# Patient Record
Sex: Female | Born: 1970
Health system: Southern US, Community
[De-identification: ages and names within clinical notes are randomized; demographics above are authoritative.]

## PROBLEM LIST (undated history)

## (undated) DIAGNOSIS — T4145XA Adverse effect of unspecified anesthetic, initial encounter: Secondary | ICD-10-CM

## (undated) DIAGNOSIS — F32A Depression, unspecified: Secondary | ICD-10-CM

## (undated) DIAGNOSIS — N912 Amenorrhea, unspecified: Secondary | ICD-10-CM

## (undated) DIAGNOSIS — E282 Polycystic ovarian syndrome: Secondary | ICD-10-CM

## (undated) DIAGNOSIS — D649 Anemia, unspecified: Secondary | ICD-10-CM

## (undated) DIAGNOSIS — I10 Essential (primary) hypertension: Secondary | ICD-10-CM

## (undated) DIAGNOSIS — M205X9 Other deformities of toe(s) (acquired), unspecified foot: Secondary | ICD-10-CM

## (undated) DIAGNOSIS — R112 Nausea with vomiting, unspecified: Secondary | ICD-10-CM

## (undated) DIAGNOSIS — Z9889 Other specified postprocedural states: Secondary | ICD-10-CM

## (undated) DIAGNOSIS — F4310 Post-traumatic stress disorder, unspecified: Secondary | ICD-10-CM

## (undated) DIAGNOSIS — K219 Gastro-esophageal reflux disease without esophagitis: Secondary | ICD-10-CM

## (undated) DIAGNOSIS — J45909 Unspecified asthma, uncomplicated: Secondary | ICD-10-CM

## (undated) DIAGNOSIS — F329 Major depressive disorder, single episode, unspecified: Secondary | ICD-10-CM

## (undated) DIAGNOSIS — R197 Diarrhea, unspecified: Secondary | ICD-10-CM

## (undated) DIAGNOSIS — Z8489 Family history of other specified conditions: Secondary | ICD-10-CM

## (undated) DIAGNOSIS — F419 Anxiety disorder, unspecified: Secondary | ICD-10-CM

## (undated) DIAGNOSIS — T8859XA Other complications of anesthesia, initial encounter: Secondary | ICD-10-CM

## (undated) DIAGNOSIS — M1990 Unspecified osteoarthritis, unspecified site: Secondary | ICD-10-CM

## (undated) DIAGNOSIS — M2020 Hallux rigidus, unspecified foot: Secondary | ICD-10-CM

## (undated) DIAGNOSIS — E7800 Pure hypercholesterolemia, unspecified: Secondary | ICD-10-CM

## (undated) DIAGNOSIS — N979 Female infertility, unspecified: Secondary | ICD-10-CM

## (undated) HISTORY — DX: Amenorrhea, unspecified: N91.2

## (undated) HISTORY — PX: KNEE ARTHROSCOPY: SUR90

## (undated) HISTORY — DX: Major depressive disorder, single episode, unspecified: F32.9

## (undated) HISTORY — DX: Polycystic ovarian syndrome: E28.2

## (undated) HISTORY — DX: Other specified postprocedural states: Z98.890

## (undated) HISTORY — DX: Pure hypercholesterolemia, unspecified: E78.00

## (undated) HISTORY — DX: Female infertility, unspecified: N97.9

## (undated) HISTORY — PX: CARPAL TUNNEL RELEASE: SHX101

## (undated) HISTORY — DX: Depression, unspecified: F32.A

## (undated) HISTORY — PX: BACK SURGERY: SHX140

## (undated) HISTORY — DX: Anemia, unspecified: D64.9

## (undated) HISTORY — DX: Gastro-esophageal reflux disease without esophagitis: K21.9

---

## 1992-08-15 HISTORY — PX: FINGER TENDON REPAIR: SHX1640

## 1998-04-09 ENCOUNTER — Ambulatory Visit (HOSPITAL_COMMUNITY): Admission: RE | Admit: 1998-04-09 | Discharge: 1998-04-09 | Payer: Self-pay | Admitting: Orthopedic Surgery

## 1998-12-14 ENCOUNTER — Other Ambulatory Visit: Admission: RE | Admit: 1998-12-14 | Discharge: 1998-12-14 | Payer: Self-pay | Admitting: Obstetrics and Gynecology

## 2000-01-28 ENCOUNTER — Other Ambulatory Visit: Admission: RE | Admit: 2000-01-28 | Discharge: 2000-01-28 | Payer: Self-pay | Admitting: Obstetrics and Gynecology

## 2001-04-25 ENCOUNTER — Encounter: Admission: RE | Admit: 2001-04-25 | Discharge: 2001-04-25 | Payer: Self-pay | Admitting: Gastroenterology

## 2001-04-25 ENCOUNTER — Encounter: Payer: Self-pay | Admitting: Gastroenterology

## 2001-05-28 ENCOUNTER — Encounter: Admission: RE | Admit: 2001-05-28 | Discharge: 2001-05-28 | Payer: Self-pay | Admitting: Gastroenterology

## 2001-05-28 ENCOUNTER — Encounter: Payer: Self-pay | Admitting: Gastroenterology

## 2002-02-12 ENCOUNTER — Other Ambulatory Visit: Admission: RE | Admit: 2002-02-12 | Discharge: 2002-02-12 | Payer: Self-pay | Admitting: Obstetrics and Gynecology

## 2002-03-11 ENCOUNTER — Encounter: Payer: Self-pay | Admitting: Emergency Medicine

## 2002-03-11 ENCOUNTER — Emergency Department (HOSPITAL_COMMUNITY): Admission: EM | Admit: 2002-03-11 | Discharge: 2002-03-11 | Payer: Self-pay | Admitting: Emergency Medicine

## 2003-01-31 ENCOUNTER — Encounter (INDEPENDENT_AMBULATORY_CARE_PROVIDER_SITE_OTHER): Payer: Self-pay | Admitting: Specialist

## 2003-01-31 ENCOUNTER — Ambulatory Visit (HOSPITAL_COMMUNITY): Admission: RE | Admit: 2003-01-31 | Discharge: 2003-01-31 | Payer: Self-pay | Admitting: Obstetrics and Gynecology

## 2003-09-30 ENCOUNTER — Ambulatory Visit (HOSPITAL_COMMUNITY): Admission: RE | Admit: 2003-09-30 | Discharge: 2003-09-30 | Payer: Self-pay | Admitting: Family Medicine

## 2004-01-14 DIAGNOSIS — Z9889 Other specified postprocedural states: Secondary | ICD-10-CM

## 2004-01-14 HISTORY — DX: Other specified postprocedural states: Z98.890

## 2004-02-04 ENCOUNTER — Ambulatory Visit (HOSPITAL_COMMUNITY): Admission: RE | Admit: 2004-02-04 | Discharge: 2004-02-05 | Payer: Self-pay | Admitting: Neurosurgery

## 2004-06-15 HISTORY — PX: TONSILLECTOMY AND ADENOIDECTOMY: SUR1326

## 2005-05-15 HISTORY — PX: CHOLECYSTECTOMY: SHX55

## 2006-05-04 ENCOUNTER — Other Ambulatory Visit: Admission: RE | Admit: 2006-05-04 | Discharge: 2006-05-04 | Payer: Self-pay | Admitting: Obstetrics and Gynecology

## 2007-05-18 ENCOUNTER — Other Ambulatory Visit: Admission: RE | Admit: 2007-05-18 | Discharge: 2007-05-18 | Payer: Self-pay | Admitting: Obstetrics and Gynecology

## 2008-06-12 ENCOUNTER — Other Ambulatory Visit: Admission: RE | Admit: 2008-06-12 | Discharge: 2008-06-12 | Payer: Self-pay | Admitting: Obstetrics and Gynecology

## 2008-06-12 ENCOUNTER — Encounter: Payer: Self-pay | Admitting: Obstetrics and Gynecology

## 2008-06-12 ENCOUNTER — Ambulatory Visit: Payer: Self-pay | Admitting: Obstetrics and Gynecology

## 2008-07-03 ENCOUNTER — Ambulatory Visit: Payer: Self-pay | Admitting: Obstetrics and Gynecology

## 2008-07-29 ENCOUNTER — Ambulatory Visit: Payer: Self-pay | Admitting: Obstetrics and Gynecology

## 2009-07-02 ENCOUNTER — Other Ambulatory Visit: Admission: RE | Admit: 2009-07-02 | Discharge: 2009-07-02 | Payer: Self-pay | Admitting: Obstetrics and Gynecology

## 2009-07-02 ENCOUNTER — Ambulatory Visit: Payer: Self-pay | Admitting: Obstetrics and Gynecology

## 2009-11-03 ENCOUNTER — Ambulatory Visit: Payer: Self-pay | Admitting: Obstetrics and Gynecology

## 2010-01-26 ENCOUNTER — Ambulatory Visit: Payer: Self-pay | Admitting: Obstetrics and Gynecology

## 2010-03-19 ENCOUNTER — Ambulatory Visit: Payer: Self-pay | Admitting: Obstetrics and Gynecology

## 2010-05-21 ENCOUNTER — Ambulatory Visit: Payer: Self-pay | Admitting: Obstetrics and Gynecology

## 2010-07-07 ENCOUNTER — Other Ambulatory Visit: Admission: RE | Admit: 2010-07-07 | Discharge: 2010-07-07 | Payer: Self-pay | Admitting: Obstetrics and Gynecology

## 2010-07-07 ENCOUNTER — Ambulatory Visit: Payer: Self-pay | Admitting: Obstetrics and Gynecology

## 2010-12-31 NOTE — H&P (Signed)
NAME:  Phyllis Delgado, Phyllis Delgado                        ACCOUNT NO.:  [id number]   MEDICAL RECORD NO.:  [id number]                   PATIENT TYPE:  AMB   LOCATION:  SDC                                  FACILITY:  [facility]   PHYSICIAN:  Guy Sandifer. Arleta Creek, M.D.           DATE OF BIRTH:  06-21-1971   DATE OF ADMISSION:  01/31/2003  DATE OF DISCHARGE:                                HISTORY & PHYSICAL   CHIEF COMPLAINT:  Heavy menses.   HISTORY OF PRESENT ILLNESS:  This patient is a 40 year old married white  female G0, P0, who has always had irregular menses.  However they have  become increasingly so over the past several months.  She has had clotting  with her menses as well.  On Dec 23, 2002 ultrasound reveals a uterus  measuring 10.3 x 4.0 x 3.3 cm.  Ovaries are polycystic in appearance.  Sonohistogram is consistent with a 13 mm endometrial mass, most likely a  polyp.  After discussion of the options she is being admitted for  hysteroscopy with resectoscope and D&C.   PAST MEDICAL HISTORY:  1. Arthritis.  2. Reflux.  3. Depression.   PAST SURGICAL HISTORY:  1. Right knee arthroscopy 1999.  2. Right carpal tunnel release 1997.  3. Left index tendon repair 1994.   SOCIAL HISTORY:  The patient denies tobacco, alcohol, or drug abuse.   FAMILY HISTORY:  Positive for diabetes in mother, father, 2 brothers and 1  sister.  Heart disease in mother and father.  Heart attacks in mother and  father.  High blood pressure in father, and diverticulosis in mother and  father.   MEDICATIONS:  1. Glucophage daily.  2. Nexium daily.   ALLERGIES:  CRESTOR leading to itching, LIPITOR leading to muscle aches.   REVIEW OF SYSTEMS:  NEUROLOGIC:  She has premenstrual migraine headaches.  PULMONARY:  Denies shortness of breath.  CARDIOVASCULAR:  Denies chest pain.  GI:  Esophageal reflux as above.  MUSCULOSKELETAL: History of arthritis.  GU: Denies dysuria or hematuria or incontinence.   PHYSICAL  EXAMINATION:  VITAL SIGNS:  Weight 233 pounds.  Blood  pressure110/74.  HEENT AND NECK:  Without thyromegaly.  LUNGS:  Clear to auscultation.  HEART:  Regular rate and rhythm.  BACK:  Without CVA tenderness.  BREASTS:  Without mass, retraction, or discharge.  ABDOMEN:  Obese, soft, nontender without palpable masses.  PELVIC:  Bulge on the cervix without lesion.  Uterus normal size, nontender.  Adnexa nontender without masses.  Pelvic exam compromised with patient  obesity.  EXTREMITIES AND NEUROLOGICAL:  Grossly within normal limits.   ASSESSMENT:  Menorrhagia.   PLAN:  1. Hysteroscopy with resectoscope.  2. Dilatation and curettage.  Guy Sandifer Arleta Creek, M.D.    JET/MEDQ  D:  01/27/2003  T:  01/27/2003  Job:  [id number]

## 2010-12-31 NOTE — Op Note (Signed)
NAME:  Phyllis Delgado, Phyllis Delgado                        ACCOUNT NO.:  [id number]   MEDICAL RECORD NO.:  [id number]                   PATIENT TYPE:  OIB   LOCATION:  3002                                 FACILITY:  [facility]   PHYSICIAN:  Coletta Memos, M.D.                  DATE OF BIRTH:  [birth year]   DATE OF PROCEDURE:  02/04/2004  DATE OF DISCHARGE:  02/05/2004                                 OPERATIVE REPORT   PREOPERATIVE DIAGNOSIS:  Displaced disk left L4-5, L5-S1.   POSTOPERATIVE DIAGNOSIS:  Displaced disk left L4-5, L5-S1.   PROCEDURE:  Left L4-5 semi-hemilaminectomy and foraminotomy with  microdissection, left L5-S1 semi-hemilaminectomy and diskectomy with  microdissection.   SURGEON:  Coletta Memos, M.D.   ASSISTANT:  Danae Orleans. Venetia Maxon, M.D.   ANESTHESIA:  General.   INDICATIONS FOR PROCEDURE:  Phyllis Delgado is a 40 year old woman who  presented with severe pain in the left lower extremity since November 14, 2003.  The pain came with left leg greater lower extremity pain, numbness extending  to the great toe on the left side. She had occasional right-sided pain.  MRI  showed a very large, herniated disk at L5-S1 and what I believed to be a  disk at L4-5. I therefore recommended, and she agreed, to undergo operative  decompression after conservative treatment was unsuccessful.   DESCRIPTION OF PROCEDURE:  [name] was brought to the operating room,  intubated and placed under a general anesthetic without difficulty.  She was  placed onto the Wilson frame and all pressure points were properly padded.  Her back was prepped and she was draped in a sterile fashion.  Using a  preoperative localizing film, I infiltrated 20 cc of 0.5% lidocaine,  1:200,000 strength epinephrine into the lumbar region.   I opened the skin with a #10 blade, and carried the incision down to the  thoracolumbar fascia.  After placing a self-retaining retractor, with the  monopolar cautery, opened the  thoracolumbar fascia, exposing the lamina of  L4-L5 and of S1.  I had another x-ray performed. I then proceded with a semi-  hemilaminectomy of L4-5 after confirming I was in the correct location.  I  then removed the ligamentum flavum between L4 and 5.  I exposed the thecal  sac and was able to retract that medially.  I did not appreciate disk  herniation, nor did I appreciate any significant tightness; but I did  perform a foraminotomy there, and felt that the L5 nerve root had free  egress.   At the L5-S1 space, however, it was a very different story.  It was  extraordinarily tight, once I had exposed the thecal sac.  What I  encountered was a partially calcified and very large disk herniation at L5-  S1. With microscopic dissection as was used at the L4-5 space, I with Dr.  Venetia Maxon, retracted the thecal sac medially and then performed  a diskectomy  using curets and the surgical, dynamic resectors to push down the calcified  portion of the mass, using  pituitary rongeurs and curets to remove the soft portion.  This was done,  and I felt that there was excellent decompression of the left S1 nerve root.  I then irrigated the wound and closed the wound in layered fashion using  Vicryl sutures.  Dermabond was used for a sterile dressing. The patient  tolerated the procedure well.                                               Coletta Memos, M.D.    KC/MEDQ  D:  02/04/2004  T:  02/05/2004  Job:  [id number]

## 2010-12-31 NOTE — Op Note (Signed)
NAME:  Phyllis Delgado, Phyllis Delgado                        ACCOUNT NO.:  [id number]   MEDICAL RECORD NO.:  [id number]                   PATIENT TYPE:  AMB   LOCATION:  SDC                                  FACILITY:  [facility]   PHYSICIAN:  Guy Sandifer. Arleta Creek, M.D.           DATE OF BIRTH:  [birth year]   DATE OF PROCEDURE:  01/31/2003  DATE OF DISCHARGE:                                 OPERATIVE REPORT   PREOPERATIVE DIAGNOSIS:  Menorrhagia.   POSTOPERATIVE DIAGNOSIS:  Endometrial polyp.   PROCEDURE:  Hysteroscopy with resection of endometrial polyp and dilatation  and curettage.   SURGEON:  Guy Sandifer. Henderson Cloud, M.D.   ANESTHESIA:  1. MAC.  2. 1% Xylocaine paracervical block.   ESTIMATED BLOOD LOSS:  Less than 50 mL.   INTAKE AND OUTPUT:  Sorbitol distending media 50 mL deficit.   SPECIMENS:  1. Endometrial curettings.  2. Endometrial polyp.   INDICATIONS AND CONSENT:  This patient is a 40 year old married white  female, G0, P0, with increasingly heavy, irregular menses.  Details are  dictated in the history and physical.  Hysteroscopy with resectoscope D&C is  discussed with the patient.  Potential risks and complications have been  discussed with the patient, including but not limited to infection, uterine  perforation, bowel, bladder, or ureteral damage, bleeding requiring  transfusion of blood products with possible transfusion reaction, HIV and  hepatitis acquisition, DVT, PE, pneumonia, hysterectomy, laparotomy,  laparoscopy.  All questions have been answered and consent is signed on the  chart.   FINDINGS:  There is an approximately 1-1.5 cm polypoid-type structure in the  upper posterior endometrial canal.   PROCEDURE:  The patient was taken to the operating room and placed in the  dorsal supine position, where anesthesia is administered with MAC.  She is  then placed in the dorsal lithotomy position, where she is prepped, the  bladder straight catheterized, and she is draped in  a sterile fashion.  A  bivalve speculum is placed in the vagina.  The anterior cervical lip is  injected with 1% Xylocaine and grasped with a single-tooth tenaculum.  A  paracervical block is placed at the 2, 4, 5, 7, 8, and 10 o'clock positions  with approximately 20 mL total of 1% Xylocaine.  The cervix is gently  progressively dilated to a 23 Pratt dilator.  Diagnostic hysteroscope is  placed in the endocervical canal and advanced under direct visualization  using sorbitol distending media.  The above findings were noted.  The  hysteroscope is withdrawn and sharp curettage is carried out.  Reinspection  with the diagnostic hysteroscope reveals the polyp to persist.  Therefore,  the cervix is dilated to a 35 Pratt dilator.  The resectoscope with the  single wide angle wire loop is placed and again advanced under direct  visualization using sorbitol distending media.  The polyp  is resected in simple fashion without difficulty.  The hysteroscope  is  withdrawn and the procedure is ended.  All instruments are removed, good  hemostasis is noted.  All counts are correct.  The patient did receive IV  antibiotics.  The patient is transferred to the recovery room in stable  condition.                                               Guy Sandifer Arleta Creek, M.D.    JET/MEDQ  D:  01/31/2003  T:  01/31/2003  Job:  [id number]

## 2011-01-03 ENCOUNTER — Ambulatory Visit (INDEPENDENT_AMBULATORY_CARE_PROVIDER_SITE_OTHER): Payer: PRIVATE HEALTH INSURANCE | Admitting: Women's Health

## 2011-01-03 DIAGNOSIS — N949 Unspecified condition associated with female genital organs and menstrual cycle: Secondary | ICD-10-CM

## 2011-01-03 DIAGNOSIS — N920 Excessive and frequent menstruation with regular cycle: Secondary | ICD-10-CM

## 2011-01-07 ENCOUNTER — Ambulatory Visit (INDEPENDENT_AMBULATORY_CARE_PROVIDER_SITE_OTHER): Payer: PRIVATE HEALTH INSURANCE | Admitting: Obstetrics and Gynecology

## 2011-01-07 ENCOUNTER — Other Ambulatory Visit: Payer: Self-pay | Admitting: Obstetrics and Gynecology

## 2011-01-07 ENCOUNTER — Other Ambulatory Visit: Payer: PRIVATE HEALTH INSURANCE

## 2011-01-07 DIAGNOSIS — N920 Excessive and frequent menstruation with regular cycle: Secondary | ICD-10-CM

## 2011-01-07 DIAGNOSIS — N949 Unspecified condition associated with female genital organs and menstrual cycle: Secondary | ICD-10-CM

## 2011-01-07 DIAGNOSIS — D3910 Neoplasm of uncertain behavior of unspecified ovary: Secondary | ICD-10-CM

## 2011-03-08 ENCOUNTER — Other Ambulatory Visit: Payer: PRIVATE HEALTH INSURANCE

## 2011-03-08 ENCOUNTER — Ambulatory Visit: Payer: Self-pay

## 2011-03-08 ENCOUNTER — Ambulatory Visit (INDEPENDENT_AMBULATORY_CARE_PROVIDER_SITE_OTHER): Payer: PRIVATE HEALTH INSURANCE | Admitting: Obstetrics and Gynecology

## 2011-03-08 ENCOUNTER — Encounter: Payer: Self-pay | Admitting: Obstetrics and Gynecology

## 2011-03-08 DIAGNOSIS — N83209 Unspecified ovarian cyst, unspecified side: Secondary | ICD-10-CM

## 2011-03-08 DIAGNOSIS — L909 Atrophic disorder of skin, unspecified: Secondary | ICD-10-CM

## 2011-03-08 DIAGNOSIS — N912 Amenorrhea, unspecified: Secondary | ICD-10-CM

## 2011-03-08 DIAGNOSIS — E282 Polycystic ovarian syndrome: Secondary | ICD-10-CM

## 2011-03-08 DIAGNOSIS — N921 Excessive and frequent menstruation with irregular cycle: Secondary | ICD-10-CM

## 2011-03-08 DIAGNOSIS — L919 Hypertrophic disorder of the skin, unspecified: Secondary | ICD-10-CM

## 2011-03-08 NOTE — Progress Notes (Signed)
The patient came today for a followup ultrasound because of a right ovarian cyst. On ultrasound today she has an anteverted uterus with a homogeneous echo pattern. Her endometrial echo is thin at 6 mm. She is having amenorrhea again with her last menstrual period at the end of April. Her right ovary still shows a cyst,  it appears possibly to be a different cyst. It is now to 2x 3 cm with a thin septum. It is negative for PSD. On the left ovary there is no cyst at all. Her cul-de-sac is negative for fluid. Findings were discussed with the patient and her husband and she was reassured. We will re\re ultrasound in 6 months. I will see her in November for her yearly visit and we will schedule it at that time. We will do medroxyprogesterone withdrawal  So she will not develop endometrial hyperplasia. She was given a prescription for medroxyprogesterone 10 mg #5 with instructions to take 1 daily for 5 days every 3 months that she doesn't bleed. She will always do a pregnancy test first.

## 2011-03-29 ENCOUNTER — Other Ambulatory Visit: Payer: Self-pay | Admitting: Obstetrics and Gynecology

## 2011-03-30 NOTE — Telephone Encounter (Signed)
PT. LOST RX FROM RECENT OV . NEEDS REFILLS. SENT TO [facility].

## 2011-05-04 ENCOUNTER — Encounter: Payer: Self-pay | Admitting: Obstetrics and Gynecology

## 2015-09-11 DIAGNOSIS — R000 Tachycardia, unspecified: Secondary | ICD-10-CM

## 2015-09-11 DIAGNOSIS — R002 Palpitations: Secondary | ICD-10-CM

## 2015-09-11 HISTORY — DX: Tachycardia, unspecified: R00.0

## 2015-09-11 HISTORY — DX: Palpitations: R00.2

## 2016-04-29 DIAGNOSIS — L4050 Arthropathic psoriasis, unspecified: Secondary | ICD-10-CM

## 2016-04-29 HISTORY — DX: Arthropathic psoriasis, unspecified: L40.50

## 2018-03-01 DIAGNOSIS — M2021 Hallux rigidus, right foot: Secondary | ICD-10-CM

## 2018-03-01 HISTORY — DX: Hallux rigidus, right foot: M20.21

## 2018-04-18 ENCOUNTER — Other Ambulatory Visit: Payer: Self-pay | Admitting: Neurosurgery

## 2018-04-19 ENCOUNTER — Other Ambulatory Visit: Payer: Self-pay

## 2018-04-19 ENCOUNTER — Encounter (HOSPITAL_COMMUNITY): Payer: Self-pay | Admitting: *Deleted

## 2018-04-19 NOTE — Progress Notes (Signed)
Phyllis Delgado denioes chest pain or shortness of breath. Patient reports that she had  Palpations in the past,  2017, she had a stress test and it was normal . No longer has palpations.   Phyllis Delgado has arthritis; RA or Psoriatic.  Patient is on [place],  Surgeon did not mention stopping. Phyllis Delgado has Type II diabetes, last A1C was 6.8 , 02/03/18.  Patient reports that CBG run 70 -150. I instructed patient to hold Metformin in am. I instructed patient to check CBG after awaking and every 2 hours until arrival  to the hospital.  I Instructed patient if CBG is less than 70 to drink 1/2 cup of a clear juice. Recheck CBG in 15 minutes then call pre- op desk at [phone] for further instructions.

## 2018-04-20 ENCOUNTER — Ambulatory Visit (HOSPITAL_COMMUNITY)
Admission: RE | Admit: 2018-04-20 | Discharge: 2018-04-21 | Disposition: A | Discharge status: Home / Self Care | Payer: PRIVATE HEALTH INSURANCE | Source: Ambulatory Visit | Attending: Neurosurgery | Admitting: Neurosurgery

## 2018-04-20 ENCOUNTER — Ambulatory Visit (HOSPITAL_COMMUNITY): Payer: PRIVATE HEALTH INSURANCE | Admitting: Anesthesiology

## 2018-04-20 ENCOUNTER — Encounter (HOSPITAL_COMMUNITY): Payer: Self-pay | Admitting: General Practice

## 2018-04-20 ENCOUNTER — Encounter (HOSPITAL_COMMUNITY)
Admission: RE | Disposition: A | Discharge status: Home / Self Care | Payer: Self-pay | Source: Ambulatory Visit | Attending: Neurosurgery

## 2018-04-20 ENCOUNTER — Ambulatory Visit (HOSPITAL_COMMUNITY): Payer: PRIVATE HEALTH INSURANCE

## 2018-04-20 DIAGNOSIS — M4712 Other spondylosis with myelopathy, cervical region: Secondary | ICD-10-CM | POA: Diagnosis present

## 2018-04-20 DIAGNOSIS — E119 Type 2 diabetes mellitus without complications: Secondary | ICD-10-CM | POA: Insufficient documentation

## 2018-04-20 DIAGNOSIS — I10 Essential (primary) hypertension: Secondary | ICD-10-CM | POA: Diagnosis not present

## 2018-04-20 DIAGNOSIS — M542 Cervicalgia: Secondary | ICD-10-CM | POA: Diagnosis present

## 2018-04-20 DIAGNOSIS — M479 Spondylosis, unspecified: Secondary | ICD-10-CM

## 2018-04-20 DIAGNOSIS — M4722 Other spondylosis with radiculopathy, cervical region: Secondary | ICD-10-CM

## 2018-04-20 DIAGNOSIS — M50022 Cervical disc disorder at C5-C6 level with myelopathy: Secondary | ICD-10-CM | POA: Insufficient documentation

## 2018-04-20 DIAGNOSIS — Z7984 Long term (current) use of oral hypoglycemic drugs: Secondary | ICD-10-CM | POA: Diagnosis not present

## 2018-04-20 HISTORY — DX: Post-traumatic stress disorder, unspecified: F43.10

## 2018-04-20 HISTORY — DX: Unspecified osteoarthritis, unspecified site: M19.90

## 2018-04-20 HISTORY — DX: Adverse effect of unspecified anesthetic, initial encounter: T41.45XA

## 2018-04-20 HISTORY — DX: Nausea with vomiting, unspecified: R11.2

## 2018-04-20 HISTORY — DX: Other spondylosis with myelopathy, cervical region: M47.12

## 2018-04-20 HISTORY — DX: Other complications of anesthesia, initial encounter: T88.59XA

## 2018-04-20 HISTORY — DX: Unspecified asthma, uncomplicated: J45.909

## 2018-04-20 HISTORY — DX: Other specified postprocedural states: Z98.890

## 2018-04-20 HISTORY — DX: Diarrhea, unspecified: R19.7

## 2018-04-20 HISTORY — DX: Anxiety disorder, unspecified: F41.9

## 2018-04-20 HISTORY — PX: ANTERIOR CERVICAL DECOMP/DISCECTOMY FUSION: SHX1161

## 2018-04-20 HISTORY — DX: Other deformities of toe(s) (acquired), unspecified foot: M20.5X9

## 2018-04-20 HISTORY — DX: Family history of other specified conditions: Z84.89

## 2018-04-20 HISTORY — DX: Essential (primary) hypertension: I10

## 2018-04-20 HISTORY — DX: Hallux rigidus, unspecified foot: M20.20

## 2018-04-20 HISTORY — DX: Other spondylosis with radiculopathy, cervical region: M47.22

## 2018-04-20 LAB — CBC
HCT: 38.8 % (ref 36.0–46.0)
Hemoglobin: 12.2 g/dL (ref 12.0–15.0)
MCH: 28.5 pg (ref 26.0–34.0)
MCHC: 31.4 g/dL (ref 30.0–36.0)
MCV: 90.7 fL (ref 78.0–100.0)
PLATELETS: 338 10*3/uL (ref 150–400)
RBC: 4.28 MIL/uL (ref 3.87–5.11)
RDW: 14.2 % (ref 11.5–15.5)
WBC: 6.7 10*3/uL (ref 4.0–10.5)

## 2018-04-20 LAB — BASIC METABOLIC PANEL
Anion gap: 9 (ref 5–15)
BUN: 13 mg/dL (ref 6–20)
CALCIUM: 9.1 mg/dL (ref 8.9–10.3)
CO2: 20 mmol/L — ABNORMAL LOW (ref 22–32)
CREATININE: 0.78 mg/dL (ref 0.44–1.00)
Chloride: 110 mmol/L (ref 98–111)
GFR calc Af Amer: >60 mL/min (ref >60)
GFR calc non Af Amer: >60 mL/min (ref >60)
Glucose, Bld: 144 mg/dL — ABNORMAL HIGH (ref 70–99)
Potassium: 4.1 mmol/L (ref 3.5–5.1)
SODIUM: 139 mmol/L (ref 135–145)

## 2018-04-20 LAB — GLUCOSE, CAPILLARY
GLUCOSE-CAPILLARY: 140 mg/dL — AB (ref 70–99)
GLUCOSE-CAPILLARY: 113 mg/dL — AB (ref 70–99)
GLUCOSE-CAPILLARY: 183 mg/dL — AB (ref 70–99)
Glucose-Capillary: 254 mg/dL — ABNORMAL HIGH (ref 70–99)

## 2018-04-20 LAB — POCT PREGNANCY, URINE: PREG TEST UR: NEGATIVE

## 2018-04-20 LAB — ABO/RH: ABO/RH(D): O POS

## 2018-04-20 LAB — HEMOGLOBIN A1C
Hgb A1c MFr Bld: 7.1 % — ABNORMAL HIGH (ref 4.8–5.6)
Mean Plasma Glucose: 157.07 mg/dL

## 2018-04-20 LAB — TYPE AND SCREEN
ABO/RH(D): O POS
ANTIBODY SCREEN: NEGATIVE

## 2018-04-20 SURGERY — ANTERIOR CERVICAL DECOMPRESSION/DISCECTOMY FUSION 1 LEVEL
Anesthesia: General

## 2018-04-20 MED ORDER — METFORMIN HCL 850 MG PO TABS
850.0000 mg | ORAL_TABLET | Freq: Two times a day (BID) | ORAL | Status: DC
  Administered 2018-04-20 – 2018-04-21 (×2): 850 mg via ORAL
  Filled 2018-04-20 (×2): qty 1

## 2018-04-20 MED ORDER — DEXAMETHASONE SODIUM PHOSPHATE 10 MG/ML IJ SOLN
INTRAMUSCULAR | Status: AC
  Filled 2018-04-20: qty 1

## 2018-04-20 MED ORDER — ACETAMINOPHEN 500 MG PO TABS
1000.0000 mg | ORAL_TABLET | Freq: Four times a day (QID) | ORAL | Status: DC
  Administered 2018-04-21 (×2): 1000 mg via ORAL
  Filled 2018-04-20 (×2): qty 2

## 2018-04-20 MED ORDER — ALBUTEROL SULFATE HFA 108 (90 BASE) MCG/ACT IN AERS: 2.0000 | INHALATION_SPRAY | Freq: Four times a day (QID) | RESPIRATORY_TRACT | Status: DC | PRN

## 2018-04-20 MED ORDER — CHLORHEXIDINE GLUCONATE CLOTH 2 % EX PADS: 6.0000 | MEDICATED_PAD | Freq: Once | CUTANEOUS | Status: DC

## 2018-04-20 MED ORDER — DOCUSATE SODIUM 100 MG PO CAPS
100.0000 mg | ORAL_CAPSULE | Freq: Two times a day (BID) | ORAL | Status: DC
  Administered 2018-04-20: 100 mg via ORAL
  Filled 2018-04-20: qty 1

## 2018-04-20 MED ORDER — MIDAZOLAM HCL 2 MG/2ML IJ SOLN
INTRAMUSCULAR | Status: AC
  Filled 2018-04-20: qty 2

## 2018-04-20 MED ORDER — FENTANYL CITRATE (PF) 100 MCG/2ML IJ SOLN
INTRAMUSCULAR | Status: AC
  Filled 2018-04-20: qty 2

## 2018-04-20 MED ORDER — DEXAMETHASONE SODIUM PHOSPHATE 10 MG/ML IJ SOLN
INTRAMUSCULAR | Status: DC | PRN
  Administered 2018-04-20: 10 mg via INTRAVENOUS

## 2018-04-20 MED ORDER — THROMBIN 5000 UNITS EX SOLR
CUTANEOUS | Status: AC
  Filled 2018-04-20: qty 10000

## 2018-04-20 MED ORDER — ONDANSETRON HCL 4 MG PO TABS: 4.0000 mg | ORAL_TABLET | Freq: Four times a day (QID) | ORAL | Status: DC | PRN

## 2018-04-20 MED ORDER — FENTANYL CITRATE (PF) 250 MCG/5ML IJ SOLN
INTRAMUSCULAR | Status: DC | PRN
  Administered 2018-04-20: 50 ug via INTRAVENOUS
  Administered 2018-04-20: 100 ug via INTRAVENOUS
  Administered 2018-04-20 (×4): 50 ug via INTRAVENOUS

## 2018-04-20 MED ORDER — SCOPOLAMINE 1 MG/3DAYS TD PT72
MEDICATED_PATCH | TRANSDERMAL | Status: AC
  Filled 2018-04-20: qty 1

## 2018-04-20 MED ORDER — PHENOL 1.4 % MT LIQD: 1.0000 | OROMUCOSAL | Status: DC | PRN

## 2018-04-20 MED ORDER — LIDOCAINE 2% (20 MG/ML) 5 ML SYRINGE
INTRAMUSCULAR | Status: AC
  Filled 2018-04-20: qty 5

## 2018-04-20 MED ORDER — LIDOCAINE 2% (20 MG/ML) 5 ML SYRINGE
INTRAMUSCULAR | Status: DC | PRN
  Administered 2018-04-20: 100 mg via INTRAVENOUS

## 2018-04-20 MED ORDER — OXYCODONE HCL ER 10 MG PO T12A
10.0000 mg | EXTENDED_RELEASE_TABLET | Freq: Two times a day (BID) | ORAL | Status: DC
  Administered 2018-04-20: 10 mg via ORAL
  Filled 2018-04-20: qty 1

## 2018-04-20 MED ORDER — HYDROXYZINE HCL 50 MG/ML IM SOLN
50.0000 mg | Freq: Four times a day (QID) | INTRAMUSCULAR | Status: DC | PRN
  Administered 2018-04-20: 50 mg via INTRAMUSCULAR
  Filled 2018-04-20: qty 1

## 2018-04-20 MED ORDER — ROCURONIUM BROMIDE 50 MG/5ML IV SOSY
PREFILLED_SYRINGE | INTRAVENOUS | Status: AC
  Filled 2018-04-20: qty 5

## 2018-04-20 MED ORDER — LIDOCAINE-EPINEPHRINE 0.5 %-1:200000 IJ SOLN
INTRAMUSCULAR | Status: DC | PRN
  Administered 2018-04-20: 7 mL

## 2018-04-20 MED ORDER — METOPROLOL TARTRATE 12.5 MG HALF TABLET
12.5000 mg | ORAL_TABLET | Freq: Two times a day (BID) | ORAL | Status: DC
  Administered 2018-04-20: 12.5 mg via ORAL
  Filled 2018-04-20: qty 1

## 2018-04-20 MED ORDER — POTASSIUM CHLORIDE IN NACL 20-0.9 MEQ/L-% IV SOLN: INTRAVENOUS | Status: DC

## 2018-04-20 MED ORDER — MIDAZOLAM HCL 5 MG/5ML IJ SOLN
INTRAMUSCULAR | Status: DC | PRN
  Administered 2018-04-20: 2 mg via INTRAVENOUS

## 2018-04-20 MED ORDER — METOCLOPRAMIDE HCL 5 MG/ML IJ SOLN
INTRAMUSCULAR | Status: AC
  Filled 2018-04-20: qty 2

## 2018-04-20 MED ORDER — MENTHOL 3 MG MT LOZG: 1.0000 | LOZENGE | OROMUCOSAL | Status: DC | PRN

## 2018-04-20 MED ORDER — THROMBIN 5000 UNITS EX SOLR
CUTANEOUS | Status: DC | PRN
  Administered 2018-04-20 (×2): 5000 [IU] via TOPICAL

## 2018-04-20 MED ORDER — LIDOCAINE-EPINEPHRINE 0.5 %-1:200000 IJ SOLN
INTRAMUSCULAR | Status: AC
  Filled 2018-04-20: qty 1

## 2018-04-20 MED ORDER — ALPRAZOLAM 0.5 MG PO TABS: 0.5000 mg | ORAL_TABLET | Freq: Every evening | ORAL | Status: DC | PRN

## 2018-04-20 MED ORDER — SODIUM CHLORIDE 0.9 % IV SOLN
250.0000 mL | INTRAVENOUS | Status: DC
  Filled 2018-04-20: qty 250

## 2018-04-20 MED ORDER — ONDANSETRON HCL 4 MG/2ML IJ SOLN
INTRAMUSCULAR | Status: AC
  Filled 2018-04-20: qty 2

## 2018-04-20 MED ORDER — OXYCODONE HCL 5 MG PO TABS
10.0000 mg | ORAL_TABLET | ORAL | Status: DC | PRN
  Administered 2018-04-21: 10 mg via ORAL
  Filled 2018-04-20: qty 2

## 2018-04-20 MED ORDER — ROCURONIUM BROMIDE 10 MG/ML (PF) SYRINGE
PREFILLED_SYRINGE | INTRAVENOUS | Status: DC | PRN
  Administered 2018-04-20: 20 mg via INTRAVENOUS
  Administered 2018-04-20: 50 mg via INTRAVENOUS

## 2018-04-20 MED ORDER — SODIUM CHLORIDE 0.9% FLUSH
3.0000 mL | Freq: Two times a day (BID) | INTRAVENOUS | Status: DC
  Administered 2018-04-20: 3 mL via INTRAVENOUS

## 2018-04-20 MED ORDER — HEMOSTATIC AGENTS (NO CHARGE) OPTIME
TOPICAL | Status: DC | PRN
  Administered 2018-04-20: 1 via TOPICAL

## 2018-04-20 MED ORDER — SODIUM CHLORIDE 0.9% FLUSH: 3.0000 mL | INTRAVENOUS | Status: DC | PRN

## 2018-04-20 MED ORDER — ACETAMINOPHEN 325 MG PO TABS: 650.0000 mg | ORAL_TABLET | ORAL | Status: DC | PRN

## 2018-04-20 MED ORDER — FENTANYL CITRATE (PF) 250 MCG/5ML IJ SOLN
INTRAMUSCULAR | Status: AC
  Filled 2018-04-20: qty 5

## 2018-04-20 MED ORDER — FENTANYL CITRATE (PF) 100 MCG/2ML IJ SOLN
25.0000 ug | INTRAMUSCULAR | Status: DC | PRN
  Administered 2018-04-20: 25 ug via INTRAVENOUS

## 2018-04-20 MED ORDER — METOCLOPRAMIDE HCL 5 MG/ML IJ SOLN
10.0000 mg | Freq: Once | INTRAMUSCULAR | Status: AC | PRN
  Administered 2018-04-20: 10 mg via INTRAVENOUS

## 2018-04-20 MED ORDER — SCOPOLAMINE 1 MG/3DAYS TD PT72
1.0000 | MEDICATED_PATCH | TRANSDERMAL | Status: DC
  Administered 2018-04-20: 1.5 mg via TRANSDERMAL

## 2018-04-20 MED ORDER — LACTATED RINGERS IV SOLN
INTRAVENOUS | Status: DC
  Administered 2018-04-20 (×2): via INTRAVENOUS

## 2018-04-20 MED ORDER — PROPOFOL 10 MG/ML IV BOLUS
INTRAVENOUS | Status: AC
  Filled 2018-04-20: qty 20

## 2018-04-20 MED ORDER — OXYCODONE HCL 5 MG PO TABS
5.0000 mg | ORAL_TABLET | ORAL | Status: DC | PRN
  Administered 2018-04-20: 5 mg via ORAL
  Filled 2018-04-20 (×2): qty 1

## 2018-04-20 MED ORDER — SUGAMMADEX SODIUM 500 MG/5ML IV SOLN
INTRAVENOUS | Status: AC
  Filled 2018-04-20: qty 5

## 2018-04-20 MED ORDER — MEPERIDINE HCL 50 MG/ML IJ SOLN: 6.2500 mg | INTRAMUSCULAR | Status: DC | PRN

## 2018-04-20 MED ORDER — APREMILAST 30 MG PO TABS: 30.0000 mg | ORAL_TABLET | Freq: Two times a day (BID) | ORAL | Status: DC

## 2018-04-20 MED ORDER — SUCCINYLCHOLINE CHLORIDE 200 MG/10ML IV SOSY
PREFILLED_SYRINGE | INTRAVENOUS | Status: AC
  Filled 2018-04-20: qty 10

## 2018-04-20 MED ORDER — GABAPENTIN 300 MG PO CAPS: 300.0000 mg | ORAL_CAPSULE | Freq: Three times a day (TID) | ORAL | Status: DC

## 2018-04-20 MED ORDER — ONDANSETRON HCL 4 MG/2ML IJ SOLN: 4.0000 mg | Freq: Four times a day (QID) | INTRAMUSCULAR | Status: DC | PRN

## 2018-04-20 MED ORDER — ZOLPIDEM TARTRATE 5 MG PO TABS: 5.0000 mg | ORAL_TABLET | Freq: Every evening | ORAL | Status: DC | PRN

## 2018-04-20 MED ORDER — PROPOFOL 10 MG/ML IV BOLUS
INTRAVENOUS | Status: DC | PRN
  Administered 2018-04-20: 200 mg via INTRAVENOUS

## 2018-04-20 MED ORDER — FLEET ENEMA 7-19 GM/118ML RE ENEM: 1.0000 | ENEMA | Freq: Once | RECTAL | Status: DC | PRN

## 2018-04-20 MED ORDER — ERGOCALCIFEROL 1.25 MG (50000 UT) PO CAPS: 50000.0000 [IU] | ORAL_CAPSULE | ORAL | Status: DC

## 2018-04-20 MED ORDER — MORPHINE SULFATE (PF) 2 MG/ML IV SOLN: 1.0000 mg | INTRAVENOUS | Status: DC | PRN

## 2018-04-20 MED ORDER — 0.9 % SODIUM CHLORIDE (POUR BTL) OPTIME
TOPICAL | Status: DC | PRN
  Administered 2018-04-20: 1000 mL

## 2018-04-20 MED ORDER — CYCLOBENZAPRINE HCL 10 MG PO TABS: 10.0000 mg | ORAL_TABLET | Freq: Three times a day (TID) | ORAL | Status: DC | PRN

## 2018-04-20 MED ORDER — CEFAZOLIN SODIUM-DEXTROSE 2-4 GM/100ML-% IV SOLN
2.0000 g | INTRAVENOUS | Status: AC
  Administered 2018-04-20: 2 g via INTRAVENOUS
  Filled 2018-04-20: qty 100

## 2018-04-20 MED ORDER — ONDANSETRON HCL 4 MG/2ML IJ SOLN
INTRAMUSCULAR | Status: DC | PRN
  Administered 2018-04-20: 4 mg via INTRAVENOUS

## 2018-04-20 MED ORDER — SUGAMMADEX SODIUM 200 MG/2ML IV SOLN
INTRAVENOUS | Status: DC | PRN
  Administered 2018-04-20: 200 mg via INTRAVENOUS

## 2018-04-20 MED ORDER — CELECOXIB 200 MG PO CAPS
200.0000 mg | ORAL_CAPSULE | Freq: Two times a day (BID) | ORAL | Status: DC
  Administered 2018-04-20: 200 mg via ORAL
  Filled 2018-04-20: qty 1

## 2018-04-20 MED ORDER — VITAMIN B-12 1000 MCG PO TABS: 1000.0000 ug | ORAL_TABLET | ORAL | Status: DC

## 2018-04-20 MED ORDER — LACTATED RINGERS IV SOLN: INTRAVENOUS | Status: DC

## 2018-04-20 MED ORDER — ACETAMINOPHEN 650 MG RE SUPP: 650.0000 mg | RECTAL | Status: DC | PRN

## 2018-04-20 MED ORDER — SENNOSIDES-DOCUSATE SODIUM 8.6-50 MG PO TABS: 1.0000 | ORAL_TABLET | Freq: Every evening | ORAL | Status: DC | PRN

## 2018-04-20 MED ORDER — BISACODYL 5 MG PO TBEC: 5.0000 mg | DELAYED_RELEASE_TABLET | Freq: Every day | ORAL | Status: DC | PRN

## 2018-04-20 MED ORDER — POLYETHYL GLYCOL-PROPYL GLYCOL 0.4-0.3 % OP GEL: Freq: Every day | OPHTHALMIC | Status: DC

## 2018-04-20 MED ORDER — FOLIC ACID 1 MG PO TABS: 1.0000 mg | ORAL_TABLET | Freq: Every day | ORAL | Status: DC

## 2018-04-20 SURGICAL SUPPLY — 58 items
ADH SKN CLS APL DERMABOND .7 (GAUZE/BANDAGES/DRESSINGS) ×1
BIT DRILL NEURO 2X3.1 SFT TUCH (MISCELLANEOUS) IMPLANT
BLADE CLIPPER SURG (BLADE) IMPLANT
BUR DRUM 4.0 (BURR) ×2 IMPLANT
BUR DRUM 4.0MM (BURR) ×1
BUR MATCHSTICK NEURO 3.0 LAGG (BURR) ×5 IMPLANT
CANISTER SUCT 3000ML PPV (MISCELLANEOUS) ×3 IMPLANT
CARTRIDGE OIL MAESTRO DRILL (MISCELLANEOUS) ×1 IMPLANT
DECANTER SPIKE VIAL GLASS SM (MISCELLANEOUS) ×3 IMPLANT
DERMABOND ADVANCED (GAUZE/BANDAGES/DRESSINGS) ×2
DERMABOND ADVANCED .7 DNX12 (GAUZE/BANDAGES/DRESSINGS) ×1 IMPLANT
DIFFUSER DRILL AIR PNEUMATIC (MISCELLANEOUS) ×3 IMPLANT
DRAPE HALF SHEET 40X57 (DRAPES) IMPLANT
DRAPE LAPAROTOMY 100X72 PEDS (DRAPES) ×3 IMPLANT
DRAPE MICROSCOPE LEICA (MISCELLANEOUS) ×3 IMPLANT
DRAPE POUCH INSTRU U-SHP 10X18 (DRAPES) ×3 IMPLANT
DRILL NEURO 2X3.1 SOFT TOUCH (MISCELLANEOUS) ×3
DURAPREP 6ML APPLICATOR 50/CS (WOUND CARE) ×3 IMPLANT
ELECT COATED BLADE 2.86 ST (ELECTRODE) ×3 IMPLANT
ELECT REM PT RETURN 9FT ADLT (ELECTROSURGICAL) ×3
ELECTRODE REM PT RTRN 9FT ADLT (ELECTROSURGICAL) ×1 IMPLANT
GAUZE 4X4 16PLY RFD (DISPOSABLE) IMPLANT
GLOVE BIOGEL PI IND STRL 6.5 (GLOVE) IMPLANT
GLOVE BIOGEL PI INDICATOR 6.5 (GLOVE) ×2
GLOVE ECLIPSE 6.5 STRL STRAW (GLOVE) ×3 IMPLANT
GLOVE EXAM NITRILE LRG STRL (GLOVE) IMPLANT
GLOVE EXAM NITRILE XL STR (GLOVE) IMPLANT
GLOVE EXAM NITRILE XS STR PU (GLOVE) IMPLANT
GLOVE SURG SS PI 6.0 STRL IVOR (GLOVE) ×2 IMPLANT
GOWN STRL REUS W/ TWL LRG LVL3 (GOWN DISPOSABLE) ×2 IMPLANT
GOWN STRL REUS W/ TWL XL LVL3 (GOWN DISPOSABLE) IMPLANT
GOWN STRL REUS W/TWL 2XL LVL3 (GOWN DISPOSABLE) IMPLANT
GOWN STRL REUS W/TWL LRG LVL3 (GOWN DISPOSABLE) ×6
GOWN STRL REUS W/TWL XL LVL3 (GOWN DISPOSABLE)
KIT BASIN OR (CUSTOM PROCEDURE TRAY) ×3 IMPLANT
KIT TURNOVER KIT B (KITS) ×3 IMPLANT
NDL HYPO 25X1 1.5 SAFETY (NEEDLE) ×1 IMPLANT
NDL SPNL 22GX3.5 QUINCKE BK (NEEDLE) ×1 IMPLANT
NEEDLE HYPO 25X1 1.5 SAFETY (NEEDLE) ×3 IMPLANT
NEEDLE SPNL 22GX3.5 QUINCKE BK (NEEDLE) ×9 IMPLANT
NS IRRIG 1000ML POUR BTL (IV SOLUTION) ×3 IMPLANT
OIL CARTRIDGE MAESTRO DRILL (MISCELLANEOUS) ×3
PACK LAMINECTOMY NEURO (CUSTOM PROCEDURE TRAY) ×3 IMPLANT
PAD ARMBOARD 7.5X6 YLW CONV (MISCELLANEOUS) ×9 IMPLANT
PIN DISTRACTION 14MM (PIN) ×2 IMPLANT
PLATE HELIX-R 24MM (Plate) ×2 IMPLANT
RUBBERBAND STERILE (MISCELLANEOUS) ×6 IMPLANT
SCREW 4.0X13 (Screw) IMPLANT
SCREW 4.0X13MM (Screw) ×8 IMPLANT
SPACER CC-ACF 8MM PARALLEL (Bone Implant) ×2 IMPLANT
SPONGE INTESTINAL PEANUT (DISPOSABLE) ×3 IMPLANT
SPONGE SURGIFOAM ABS GEL SZ50 (HEMOSTASIS) ×3 IMPLANT
SUT VIC AB 0 CT1 27 (SUTURE) ×3
SUT VIC AB 0 CT1 27XBRD ANTBC (SUTURE) IMPLANT
SUT VIC AB 3-0 SH 8-18 (SUTURE) ×3 IMPLANT
TOWEL GREEN STERILE (TOWEL DISPOSABLE) ×3 IMPLANT
TOWEL GREEN STERILE FF (TOWEL DISPOSABLE) ×3 IMPLANT
WATER STERILE IRR 1000ML POUR (IV SOLUTION) ×3 IMPLANT

## 2018-04-20 NOTE — Anesthesia Postprocedure Evaluation (Signed)
Anesthesia Post Note  Patient: Phyllis Delgado  Procedure(s) Performed: Cervical 5-6 Anterior cervical decompression/discectomy/fusion (N/A )     Patient location during evaluation: PACU Anesthesia Type: General Level of consciousness: awake and alert Pain management: pain level controlled Vital Signs Assessment: post-procedure vital signs reviewed and stable Respiratory status: spontaneous breathing, nonlabored ventilation, respiratory function stable and patient connected to nasal cannula oxygen Cardiovascular status: blood pressure returned to baseline and stable Postop Assessment: no apparent nausea or vomiting Anesthetic complications: no    Last Vitals:  Vitals:   04/20/18 0758 04/20/18 1336  BP: (!) 114/51 106/65  Pulse: 81 (!) 119  Resp: 18 (P) 18  Temp: 36.8 C (P) 36.6 C  SpO2: 98% 90%    Last Pain:  Vitals:   04/20/18 1336  TempSrc:   PainSc: (P) 0-No pain                 Montez Hageman

## 2018-04-20 NOTE — Transfer of Care (Signed)
Immediate Anesthesia Transfer of Care Note  Patient: Phyllis Delgado  Procedure(s) Performed: Cervical 5-6 Anterior cervical decompression/discectomy/fusion (N/A )  Patient Location: PACU  Anesthesia Type:General  Level of Consciousness: awake, alert , oriented and patient cooperative  Airway & Oxygen Therapy: Patient Spontanous Breathing and Patient connected to nasal cannula oxygen  Post-op Assessment: Report given to RN, Post -op Vital signs reviewed and stable and Patient moving all extremities  Post vital signs: Reviewed and stable  Last Vitals:  Vitals Value Taken Time  BP 106/65 04/20/2018  1:36 PM  Temp    Pulse 116 04/20/2018  1:38 PM  Resp 18 04/20/2018  1:38 PM  SpO2 92 % 04/20/2018  1:38 PM  Vitals shown include unvalidated device data.  Last Pain:  Vitals:   04/20/18 0758  TempSrc: Oral  PainSc: 3       Patients Stated Pain Goal: 3 (84/78/41 2820)  Complications: No apparent anesthesia complications

## 2018-04-20 NOTE — Anesthesia Procedure Notes (Signed)
Procedure Name: Intubation Date/Time: 04/20/2018 10:56 AM Performed by: Myna Bright, CRNA Pre-anesthesia Checklist: Patient identified, Emergency Drugs available, Suction available and Patient being monitored Patient Re-evaluated:Patient Re-evaluated prior to induction Oxygen Delivery Method: Circle system utilized Preoxygenation: Pre-oxygenation with 100% oxygen Induction Type: IV induction Ventilation: Mask ventilation without difficulty Laryngoscope Size: Glidescope and 3 Grade View: Grade I Tube type: Oral Tube size: 7.0 mm Number of attempts: 1 Airway Equipment and Method: Stylet and Video-laryngoscopy Placement Confirmation: ETT inserted through vocal cords under direct vision,  positive ETCO2 and breath sounds checked- equal and bilateral Secured at: 21 cm Tube secured with: Tape Dental Injury: Teeth and Oropharynx as per pre-operative assessment  Comments: Easy mask. Glidescope used d/t MPIII. Grade I view with Glide.

## 2018-04-20 NOTE — Op Note (Signed)
04/20/2018  2:41 PM  PATIENT:  Phyllis Delgado  47 y.o. female with a large disc causing cord compression  PRE-OPERATIVE DIAGNOSIS:  Herniated nucleus pulposus with myelopathy, cervical region  POST-OPERATIVE DIAGNOSIS:  HNP w myelopathy  PROCEDURE:  Anterior Cervical decompression C5/6 Arthrodesis C5/6 with 42mm structural allograft(synthes) Anterior instrumentation(Nuvasive) C5-6  SURGEON:   Surgeon(s): Ashok Pall, MD Earnie Larsson, MD   ASSISTANTS:Pool, Mallie Mussel  ANESTHESIA:   general  EBL:  Total I/O In: 1000 [I.V.:1000] Out: 150 [Blood:150]  BLOOD ADMINISTERED:none  CELL SAVER GIVEN:none  COUNT:per nursing  DRAINS: none   SPECIMEN:  No Specimen  DICTATION: Phyllis Delgado was taken to the operating room, intubated, and placed under general anesthesia without difficulty. She was positioned supine with her head in slight extension on a horseshoe headrest. The neck was prepped and draped in a sterile manner. I infiltrated 8 cc's 1/2%lidocaine/1:200,000 strength epinephrine into the planned incision starting from the midline to the medial border of the left sternocleidomastoid muscle. I opened the incision with a 10 blade and dissected sharply through soft tissue to the platysma. I dissected in the plane superior to the platysma both rostrally and caudally. I then opened the platysma in a horizontal fashion with Metzenbaum scissors, and dissected in the inferior plane rostrally and caudally. With both blunt and sharp technique I created an avascular corridor to the cervical spine. I placed a spinal needle(s) in the disc space at 3/4 . I then reflected the longus colli from C5 to C6 and placed self retaining retractors. I opened the disc space(s) at 5/6 with a 15 blade. I removed disc with curettes, Kerrison punches, and the drill. Using the drill I removed osteophytes and prepared for the decompression.  I decompressed the spinal canal and the C6 root(s) with the drill, Kerrison punches,  and the curettes. I used the microscope to aid in microdissection. I removed the posterior longitudinal ligament to fully expose and decompress the thecal sac. I exposed the roots laterally taking down the C5/6 uncovertebral joints. What I thought was disc on the MRI was actually a very large bone spur mixed in with disc. I used the drill to thin the spur until I removed it with pituitary rongeurs, and Kerrison punches. With the decompression complete we moved on to the arthrodesis. I used the drill to level the surfaces of C5, and C6. I removed soft tissue to prepare the disc space and the bony surfaces. I measured the space and placed an 89mm structural allograft into the disc space.  I then placed the anterior instrumentation. I placed 2 screws in each vertebral body through the plate. I locked the screws into place. Intraoperative xray showed the graft, plate, and screws to be in good position. I irrigated the wound, achieved hemostasis, and closed the wound in layers. I approximated the platysma, and the subcuticular plane with vicryl sutures. I used Dermabond for a sterile dressing.   PLAN OF CARE: Admit for overnight observation  PATIENT DISPOSITION:  PACU - hemodynamically stable.   Delay start of Pharmacological VTE agent (>24hrs) due to surgical blood loss or risk of bleeding:  yes

## 2018-04-20 NOTE — Anesthesia Preprocedure Evaluation (Signed)
Anesthesia Evaluation  Patient identified by MRN, date of birth, ID band Patient awake    Reviewed: Allergy & Precautions, NPO status , Patient's Chart, lab work & pertinent test results  History of Anesthesia Complications (+) PONV  Airway Mallampati: II  TM Distance: >3 FB Neck ROM: Full    Dental no notable dental hx.    Pulmonary asthma ,    Pulmonary exam normal breath sounds clear to auscultation       Cardiovascular hypertension, Pt. on medications and Pt. on home beta blockers Normal cardiovascular exam Rhythm:Regular Rate:Normal     Neuro/Psych negative neurological ROS  negative psych ROS   GI/Hepatic Neg liver ROS, GERD  Controlled,  Endo/Other  diabetes, Type 2, Oral Hypoglycemic Agents  Renal/GU negative Renal ROS  negative genitourinary   Musculoskeletal negative musculoskeletal ROS (+)   Abdominal   Peds negative pediatric ROS (+)  Hematology negative hematology ROS (+)   Anesthesia Other Findings   Reproductive/Obstetrics negative OB ROS                             Anesthesia Physical Anesthesia Plan  ASA: III  Anesthesia Plan: General   Post-op Pain Management:    Induction: Intravenous  PONV Risk Score and Plan: 4 or greater and Ondansetron, Dexamethasone, Treatment may vary due to age or medical condition, Midazolam and Scopolamine patch - Pre-op  Airway Management Planned: Oral ETT  Additional Equipment:   Intra-op Plan:   Post-operative Plan: Extubation in OR  Informed Consent: I have reviewed the patients History and Physical, chart, labs and discussed the procedure including the risks, benefits and alternatives for the proposed anesthesia with the patient or authorized representative who has indicated his/her understanding and acceptance.   Dental advisory given  Plan Discussed with: CRNA  Anesthesia Plan Comments:         Anesthesia  Quick Evaluation

## 2018-04-20 NOTE — H&P (Signed)
BP (!) 114/51   Pulse 81   Temp 98.3 F (36.8 C) (Oral)   Resp 18   Ht 5' 1.5" (1.562 m)   Wt 94.8 kg   LMP 04/06/2018 (Exact Date)   SpO2 98%   BMI 38.85 kg/m   Phyllis Delgado returns today.  She is having a lot of pain and numbness in her neck and upper extremities.  She feels tingling in the hands bilaterally.  She has been dropping a lot of objects with her right hand recently.  She says it is truly bugging her and causing problems.  She does feel weak in the right hand.  She also reports some numbness and very little pain in the left buttocks, but that is not a chief concern.  She states that she thinks her balance has been somewhat poor recently.  Says she notices if she wakes up on her left side that her balance will be off for a good portion of the day.   PHYSICAL EXAMINATION: Vital signs:  She is 5 feet 2 inches, weighs 210 pounds.  Temperature is 98.1, blood pressure is 120/76, pulse is 87, pain is 2/10.  Neurologic:  Shows bilateral Hoffman sign. 3+ reflexes at the knees, ankles, brachioradialis.  She has a suprapatellar jerk.  She has a few beats of clonus in the right foot.  None exhibited on the left.  Pupils equal, round, and reactive to light.  Full extraocular movements.  Full visual fields.  No nystagmus.  Hearing intact to voice.  Uvula elevates in midline.  Shoulder shrug is normal.  Tongue protrudes in the midline.   ASSESSMENT AND PLAN: It appears on exam that she has some hyperreflexia.  I looked back on my exam from 2016 and I did not mention it.  Could have been a poor exam on my part or it simply was not there.  Thus, I will go ahead and perform a cervical MRI without contrast to assess for her spinal cord status.  MRI revealed a large disc herniation causing spinal cord compression.  BP (!) 114/51   Pulse 81   Temp 98.3 F (36.8 C) (Oral)   Resp 18   Ht 5' 1.5" (1.562 m)   Wt 94.8 kg   LMP 04/06/2018 (Exact Date)   SpO2 98%   BMI 38.85 kg/m  [name] has  decided to undergo an anterior cervical decompression and arthrodesis for spinal cord compression at levels C5/6. Risks and benefits including but not limited to bleeding, infection, paralysis, weakness in one or both extremities, bowel and/or bladder dysfunction, fusion failure, hardware failure, need for further surgery, no relief of pain. She understands and wishes to proceed.

## 2018-04-21 DIAGNOSIS — M50022 Cervical disc disorder at C5-C6 level with myelopathy: Secondary | ICD-10-CM | POA: Diagnosis not present

## 2018-04-21 LAB — GLUCOSE, CAPILLARY: Glucose-Capillary: 170 mg/dL — ABNORMAL HIGH (ref 70–99)

## 2018-04-21 MED ORDER — HYDROCODONE-ACETAMINOPHEN 5-325 MG PO TABS: 1.0000 | ORAL_TABLET | Freq: Four times a day (QID) | ORAL | 0 refills | Status: DC | PRN

## 2018-04-21 MED ORDER — TIZANIDINE HCL 4 MG PO TABS: 4.0000 mg | ORAL_TABLET | Freq: Four times a day (QID) | ORAL | 0 refills | Status: DC | PRN

## 2018-04-21 NOTE — Discharge Summary (Signed)
Physician Discharge Summary  Patient ID: Phyllis PLACZEK MRN: [id number] DOB/AGE: 01-06-1971 47 y.o.  Admit date: 04/20/2018 Discharge date: 04/21/2018  Admission Diagnoses:Cervical hnp with myelopathy  Discharge Diagnoses:  Active Problems:   Cervical spondylosis with myelopathy and radiculopathy   Discharged Condition: good  Hospital Course: Phyllis Delgado was admitted and taken to the operating room for an uncomplicated ACDF at Y7/0. Post op she is ambulating, voiding, and tolerating a regular diet. Her voice is strong, strength is at her baseline, moving all extremities well. The wound is clean, dry, and without signs of infection. She is tolerating a regular diet.   Treatments: surgery: PROCEDURE:  Anterior Cervical decompression C5/6 Arthrodesis C5/6 with 6mm structural allograft(synthes) Anterior instrumentation(Nuvasive) C5-6  Discharge Exam: Blood pressure 129/84, pulse 100, temperature 98 F (36.7 C), temperature source Oral, resp. rate 18, height 5' 1.5" (1.562 m), weight 94.8 kg, last menstrual period 04/06/2018, SpO2 97 %. General appearance: alert, cooperative, appears stated age and no distress  Disposition: Discharge disposition: 01-Home or Self Care      Herniated nucleus pulposus with myelopathy, cervical region  Allergies as of 04/21/2018      Reactions   Cymbalta [duloxetine Hcl] Anaphylaxis   Elavil [amitriptyline Hcl] Anaphylaxis   Gabapentin Swelling   MOUTH SWELLING   Humira [adalimumab] Anaphylaxis   Fluid on the heart   Lyrica [pregabalin] Anaphylaxis   Ezetimibe-simvastatin Other (See Comments)   Muscle ache   Fenofibrate Other (See Comments)   Muscle ache   Miconazole Swelling   Vaginal swelling and redness   Statins Other (See Comments)   Muscle ache      Medication List    STOP taking these medications   meloxicam 15 MG tablet Commonly known as:  MOBIC     TAKE these medications   albuterol 108 (90 Base) MCG/ACT inhaler Commonly known  as:  PROVENTIL HFA;VENTOLIN HFA Inhale 2 puffs into the lungs every 6 (six) hours as needed for wheezing or shortness of breath.   ALPRAZolam 0.5 MG tablet Commonly known as:  XANAX Take 0.5 mg by mouth at bedtime as needed for sleep.   cyanocobalamin 1000 MCG/ML injection Commonly known as:  (VITAMIN B-12) Inject 1,000 mcg into the muscle every 30 (thirty) days.   vitamin B-12 1000 MCG tablet Commonly known as:  CYANOCOBALAMIN Take 1,000 mcg by mouth every Monday, Wednesday, and Friday.   ergocalciferol 50000 units capsule Commonly known as:  VITAMIN D2 Take 50,000 Units by mouth once a week. Sundays   folic acid 1 MG tablet Commonly known as:  FOLVITE Take 1 mg by mouth daily.   HYDROcodone-acetaminophen 5-325 MG tablet Commonly known as:  NORCO/VICODIN Take 1 tablet by mouth every 6 (six) hours as needed for moderate pain.   hydrocortisone cream 1 % Apply 1 application topically daily as needed (psoriasis).   medroxyPROGESTERone 10 MG tablet Commonly known as:  PROVERA TAKE 1 TABLET DAILY.   metFORMIN 850 MG tablet Commonly known as:  GLUCOPHAGE Take 850 mg by mouth 2 (two) times daily with a meal.   metoprolol tartrate 25 MG tablet Commonly known as:  LOPRESSOR Take 12.5 mg by mouth 2 (two) times daily.   metroNIDAZOLE 0.75 % vaginal gel Commonly known as:  [place] 1 application vaginally daily as needed for irritation.   OTEZLA 30 MG Tabs Generic drug:  Apremilast Take 30 mg by mouth 2 (two) times daily.   predniSONE 5 MG tablet Commonly known as:  DELTASONE Take 5 mg by  mouth daily as needed (arthritis).   SYSTANE OP Place 1 drop into both eyes at bedtime.   tiZANidine 4 MG tablet Commonly known as:  ZANAFLEX Take 1 tablet (4 mg total) by mouth every 6 (six) hours as needed for muscle spasms.      Follow-up Information    Ashok Pall, MD Follow up in 3 week(s).   Specialty:  Neurosurgery Why:  please call the office to make an  appointmetnt Contact information: [address] [place] [address] [place] [address] [phone]           Signed: Winfield Cunas 04/21/2018, 7:37 AM

## 2018-04-21 NOTE — Evaluation (Signed)
Occupational Therapy Evaluation Patient Details Name: Phyllis Delgado MRN: [id number] DOB: 04/13/1971 Today's Date: 04/21/2018    History of Present Illness Patient is a 47 yo female s/p Anterior Cervical decompression C5/6. PMH significant for but not limited to: PTSD, arthritis, depression.   Clinical Impression   PTA patient independent but reports increased pain and numbness in B UEs limiting function.  She currently is able to complete all self care and transfers with supervision to modified independence.  Good recall of precautions.  Educated on safety, precautions, ADL compensatory techniques, AE as needed and recommendations.  Patient with no further questions or concerns.  Anticipate patient will progress well, as pain and numbness already significantly better than before surgery.  At this time, no further OT needs identified and OT signing off.  Thank you for this referral!     Follow Up Recommendations  No OT follow up    Equipment Recommendations  None recommended by OT    Recommendations for Other Services       Precautions / Restrictions Precautions Precautions: Cervical Precaution Booklet Issued: Yes (comment) Precaution Comments: reviewed with patient Restrictions Weight Bearing Restrictions: No      Mobility Bed Mobility Overal bed mobility: Modified Independent             General bed mobility comments: demonstrated good recall of technqiue without cueing  Transfers Overall transfer level: Modified independent Equipment used: None                  Balance Overall balance assessment: Independent                                         ADL either performed or assessed with clinical judgement   ADL Overall ADL's : Needs assistance/impaired Eating/Feeding: Modified independent;Sitting   Grooming: Modified independent;Standing Grooming Details (indicate cue type and reason): reviewed compensatory techniques for safety Upper  Body Bathing: Modified independent;Sitting   Lower Body Bathing: Set up;Sit to/from stand Lower Body Bathing Details (indicate cue type and reason): reviewed safety to compelte bathing seated initially, able to complete figure 4 technique to reach feet Upper Body Dressing : Modified independent;Sitting   Lower Body Dressing: Supervision/safety;Sit to/from stand Lower Body Dressing Details (indicate cue type and reason): for safety, able to complete figure 4 technique; reviewed precautions  Toilet Transfer: Modified Independent;Ambulation;Regular Toilet   Toileting- Water quality scientist and Hygiene: Supervision/safety;Sit to/from stand;Cueing for back precautions Toileting - Clothing Manipulation Details (indicate cue type and reason): reviewed precautions with toileting techniques Tub/ Shower Transfer: Supervision/safety;Tub transfer;Ambulation;Tub bench Tub/Shower Transfer Details (indicate cue type and reason): reviewed safety, completing tub treshold transfer without assistance Functional mobility during ADLs: Modified independent General ADL Comments: highly motivated, pain and functional use of BUEs greatly improved since surgery; reviewed compensatory techniques for ADLs and precautions     Vision Baseline Vision/History: Wears glasses Wears Glasses: Reading only Patient Visual Report: No change from baseline Vision Assessment?: No apparent visual deficits     Perception     Praxis      Pertinent Vitals/Pain Pain Assessment: Faces Faces Pain Scale: Hurts a little bit Pain Location: anterior neck Pain Descriptors / Indicators: Sore;Discomfort Pain Intervention(s): Monitored during session     Hand Dominance Right   Extremity/Trunk Assessment Upper Extremity Assessment Upper Extremity Assessment: Generalized weakness;RUE deficits/detail;LUE deficits/detail(signficantly improved since surgery ) RUE Deficits / Details: WFL to 90 FF (cervical  limitations)  RUE Sensation:  decreased light touch(signifincatly improved) RUE Coordination: WNL LUE Deficits / Details: WFL to 90 FF (cervical limitations)  LUE Sensation: decreased light touch(signficnatly improved) LUE Coordination: WNL   Lower Extremity Assessment Lower Extremity Assessment: Defer to PT evaluation   Cervical / Trunk Assessment Cervical / Trunk Assessment: Other exceptions Cervical / Trunk Exceptions: s/p cervical sx   Communication Communication Communication: No difficulties   Cognition Arousal/Alertness: Awake/alert Behavior During Therapy: WFL for tasks assessed/performed Overall Cognitive Status: Within Functional Limits for tasks assessed                                     General Comments       Exercises     Shoulder Instructions      Home Living Family/patient expects to be discharged to:: Private residence Living Arrangements: Spouse/significant other Available Help at Discharge: Family;Available 24 hours/day Type of Home: House Home Access: Stairs to enter [employer] of Steps: 6 Entrance Stairs-Rails: Can reach both Home Layout: One level     Bathroom Shower/Tub: Tub/shower unit;Walk-in shower   Bathroom Toilet: Standard     Home Equipment: Environmental consultant - 4 wheels;Tub bench;Hand held shower head          Prior Functioning/Environment Level of Independence: Independent                 OT Problem List: Decreased strength;Decreased knowledge of precautions;Impaired sensation;Pain      OT Treatment/Interventions:      OT Goals(Current goals can be found in the care plan section) Acute Rehab OT Goals Patient Stated Goal: to get home today OT Goal Formulation: With patient  OT Frequency:     Barriers to D/C:            Co-evaluation              AM-PAC PT "6 Clicks" Daily Activity     Outcome Measure Help from another person eating meals?: None Help from another person taking care of personal grooming?: None Help  from another person toileting, which includes using toliet, bedpan, or urinal?: None Help from another person bathing (including washing, rinsing, drying)?: None Help from another person to put on and taking off regular upper body clothing?: None Help from another person to put on and taking off regular lower body clothing?: None 6 Click Score: 24   End of Session Nurse Communication: Mobility status  Activity Tolerance: Patient tolerated treatment well Patient left: with call bell/phone within reach;Other (comment)(seated EOB)  OT Visit Diagnosis: Pain Pain - part of body: (neck)                Time: 1601-0932 OT Time Calculation (min): 13 min Charges:  OT General Charges $OT Visit: 1 Visit OT Evaluation $OT Eval Low Complexity: 1 Low  Delight Stare, OT Acute Rehabilitation Services Pager [phone] Office [phone]   Delight Stare 04/21/2018, 8:31 AM

## 2018-04-21 NOTE — Discharge Instructions (Signed)
Anterior Cervical Fusion °Care After °Pinching of the nerves is a common cause of long-term pain. When this happens, a procedure called an anterior cervical fusion is sometimes performed. It relieves the pressure on the pinched nerve roots or spinal cord in the neck. °An anterior cervical fusion means that the operation is done through the front (anterior) of your neck to fuse bones in your neck together. This procedure is done to relieve the pressure on pinched nerve roots or spinal cord. This operation is done to control the movement of your spine, which may be pressing on the nerves. This may relieve the pain. The procedure that stops the movement of the spine is called a fusion. The cut by the surgeon (incision) is usually within a skin fold line under your chin. After moving the neck muscles gently apart, the neurosurgeon uses an operating microscope and removes the injured intervertebral disk (the cushion or pad of tissue between the bones of the spine). This takes the pressure off the nerves or spinal cord. This is called decompression. The area where the disc was removed is then filled with a bone graft. The graft will fuse the vertebrae together over time. This means it causes the vertebral bodies to grow together. The bone graft may be obtained from your own bone (your hip for example), or may be obtained from a bone bank. Receiving bone from a bone bank is similar to a blood bank, only the bone comes from human donors who have recently died. This type of graft is referred to as allograft bone. The preformed bone plug is safe and will not be rejected by your body. It does not contain blood cells. °In some cases, the surgeon may use hardware in your neck to help stabilize it. This means that metal plates or pins or screws may be used to: °· Provide extra support to the neck.  °· Help the bones to grow together more easily.  °A cervical fusion procedure takes a couple hours to several hours, depending on  what needs to be done. Your caregiver will be able to answer your questions for you. °HOME CARE INSTRUCTIONS  °· It will be normal to have a sore throat and have difficulty swallowing foods for a couple weeks following surgery. See your caregiver if this seems to be getting worse rather than better.  °· You may resume normal diet and activities as directed or allowed. Generally, walking and stair climbing are fine. Avoid lifting more than ten pounds and do no lifting above your head.  °· If given a cervical collar, remove only for bathing and eating, or as directed.  °· Use only showers for cleaning up, with no bathing, until seen.  °· You may apply ice to the surgical or bone donor site for 15 to 20 minutes each hour while awake for the first couple days following surgery. Put the ice in a plastic bag and place a towel between the bag of ice and your skin.  °· Change dressings if necessary or as directed.  °· You may drive in 10 days  °· Take prescribed medication as directed. Only take over-the-counter or prescription medicines for pain, discomfort, or fever as directed by your caregiver.  °· Make an appointment to see your caregiver for suture or staple removal when instructed.  °· If physical therapy was prescribed, follow your caregiver's directions.  °SEEK IMMEDIATE MEDICAL CARE IF: °· There is redness, swelling, or increasing pain in the wound.  °· There is   pus coming from the wound.   An unexplained oral temperature over 102 F (38.9 C) develops.   There is a bad smell coming from the wound or dressing.   You have swelling in your calf or leg.   You develop shortness of breath or chest pain.   The wound edges break open after sutures or staples have been removed.   Your pain is not controlled with medicine.   You seem to be getting worse rather than better.  Document Released: 03/15/2004 Document Revised: 04/13/2011 Document Reviewed: 05/21/2008   Wound Care Leave incision open to  air. You may shower. Do not scrub directly on incision.  Do not put any creams, lotions, or ointments on incision. Activity Walk each and every day, increasing distance each day. No lifting greater than 5 lbs.  Avoid excessive neck motion. No driving for 2 weeks; may ride as a passenger locally. Wear neck brace at all times except when showering.  If provided soft collar, may wear for comfort unless otherwise instructed. Diet Resume your normal diet.  Return to Work Will be discussed at you follow up appointment. Call Your Doctor If Any of These Occur Redness, drainage, or swelling at the wound.  Temperature greater than 101 degrees. Severe pain not relieved by pain medication. Increased difficulty swallowing. Incision starts to come apart. Follow Up Appt Call today for appointment in 3 weeks ([phone]) or for problems.  If you have any hardware placed in your spine, you will need an x-ray before your appointment. ExitCare Patient Information 2012 [place].

## 2018-04-21 NOTE — Progress Notes (Signed)
Patient alert and oriented, mae's well, voiding adequate amount of urine, swallowing without difficulty, no c/o pain at time of discharge. Patient discharged home with family. Script and discharged instructions given to patient. Patient and family stated understanding of instructions given. Patient has an appointment with Dr. Cabbell   

## 2018-04-21 NOTE — Evaluation (Signed)
Physical Therapy Evaluation Patient Details Name: Phyllis Delgado MRN: [id number] DOB: Jul 28, 1971 Today's Date: 04/21/2018   History of Present Illness  Patient is a 47 yo female s/p Anterior Cervical decompression C5/6  Clinical Impression  Patient seen for mobility assessment s/p spinal surgery. Mobilizing well. Educated patient on precautions, mobility expectations, safety and car transfers. No further acute PT needs. Will sign off.     Follow Up Recommendations No PT follow up    Equipment Recommendations  None recommended by PT    Recommendations for Other Services       Precautions / Restrictions Precautions Precautions: Cervical Precaution Booklet Issued: Yes (comment) Precaution Comments: verbally reviewed      Mobility  Bed Mobility Overal bed mobility: Modified Independent             General bed mobility comments: educated on technique  Transfers Overall transfer level: Modified independent Equipment used: None                Ambulation/Gait Ambulation/Gait assistance: Independent Gait Distance (Feet): 410 Feet Assistive device: None Gait Pattern/deviations: WFL(Within Functional Limits)        Stairs Stairs: Yes Stairs assistance: Modified independent (Device/Increase time) Stair Management: One rail Right Number of Stairs: 4 General stair comments: educated on technique for blind Programmer, applications    Modified Rankin (Stroke Patients Only)       Balance Overall balance assessment: Independent                                           Pertinent Vitals/Pain Pain Assessment: Faces Faces Pain Scale: Hurts little more Pain Location: anterior neck Pain Descriptors / Indicators: Sore Pain Intervention(s): Monitored during session    Home Living Family/patient expects to be discharged to:: Private residence Living Arrangements: Spouse/significant other Available Help at Discharge: Family Type of  Home: House Home Access: Stairs to enter Entrance Stairs-Rails: Can reach both Entrance Stairs-Number of Steps: 6 Home Layout: One level Home Equipment: None      Prior Function Level of Independence: Independent               Hand Dominance   Dominant Hand: Right    Extremity/Trunk Assessment   Upper Extremity Assessment Upper Extremity Assessment: Defer to OT evaluation    Lower Extremity Assessment Lower Extremity Assessment: Overall WFL for tasks assessed    Cervical / Trunk Assessment Cervical / Trunk Assessment: (s/p cervical surgery)  Communication   Communication: No difficulties  Cognition Arousal/Alertness: Awake/alert Behavior During Therapy: WFL for tasks assessed/performed Overall Cognitive Status: Within Functional Limits for tasks assessed                                        General Comments      Exercises     Assessment/Plan    PT Assessment Patent does not need any further PT services  PT Problem List         PT Treatment Interventions      PT Goals (Current goals can be found in the Care Plan section)  Acute Rehab PT Goals PT Goal Formulation: All assessment and education complete, DC therapy    Frequency     Barriers to discharge        Co-evaluation  AM-PAC PT "6 Clicks" Daily Activity  Outcome Measure Difficulty turning over in bed (including adjusting bedclothes, sheets and blankets)?: None Difficulty moving from lying on back to sitting on the side of the bed? : None Difficulty sitting down on and standing up from a chair with arms (e.g., wheelchair, bedside commode, etc,.)?: A Little Help needed moving to and from a bed to chair (including a wheelchair)?: None Help needed walking in hospital room?: None Help needed climbing 3-5 steps with a railing? : A Little 6 Click Score: 22    End of Session   Activity Tolerance: Patient tolerated treatment well Patient left: in bed;with  call bell/phone within reach(sitting EOB) Nurse Communication: Mobility status PT Visit Diagnosis: Other symptoms and signs involving the nervous system (Z30.076)    Time: 2263-3354 PT Time Calculation (min) (ACUTE ONLY): 17 min   Charges:   PT Evaluation $PT Eval Low Complexity: [place], PT DPT  Board Certified Neurologic Specialist [place] 04/21/2018, 7:49 AM

## 2018-04-23 ENCOUNTER — Encounter (HOSPITAL_COMMUNITY): Payer: Self-pay | Admitting: Neurosurgery

## 2019-04-18 DIAGNOSIS — M7661 Achilles tendinitis, right leg: Secondary | ICD-10-CM | POA: Insufficient documentation

## 2019-04-18 DIAGNOSIS — M6701 Short Achilles tendon (acquired), right ankle: Secondary | ICD-10-CM

## 2019-04-18 HISTORY — DX: Achilles tendinitis, right leg: M76.61

## 2019-04-18 HISTORY — DX: Short Achilles tendon (acquired), right ankle: M67.01

## 2019-04-25 DIAGNOSIS — M7731 Calcaneal spur, right foot: Secondary | ICD-10-CM

## 2019-04-25 HISTORY — DX: Calcaneal spur, right foot: M77.31

## 2019-05-20 DIAGNOSIS — M7750 Other enthesopathy of unspecified foot: Secondary | ICD-10-CM

## 2019-05-20 HISTORY — DX: Other enthesopathy of unspecified foot and ankle: M77.50

## 2019-06-07 IMAGING — CR DG CERVICAL SPINE 2 OR 3 VIEWS
2 series · 2 of 2 positions shown · non-contrast
Comparison: MR Villers on 04/13/2018

CLINICAL DATA: C5-6 ACDF LMP verified prior to exam,[id number]

EXAM:
CERVICAL SPINE - 2-3 VIEW

[AP (1 of 2)]
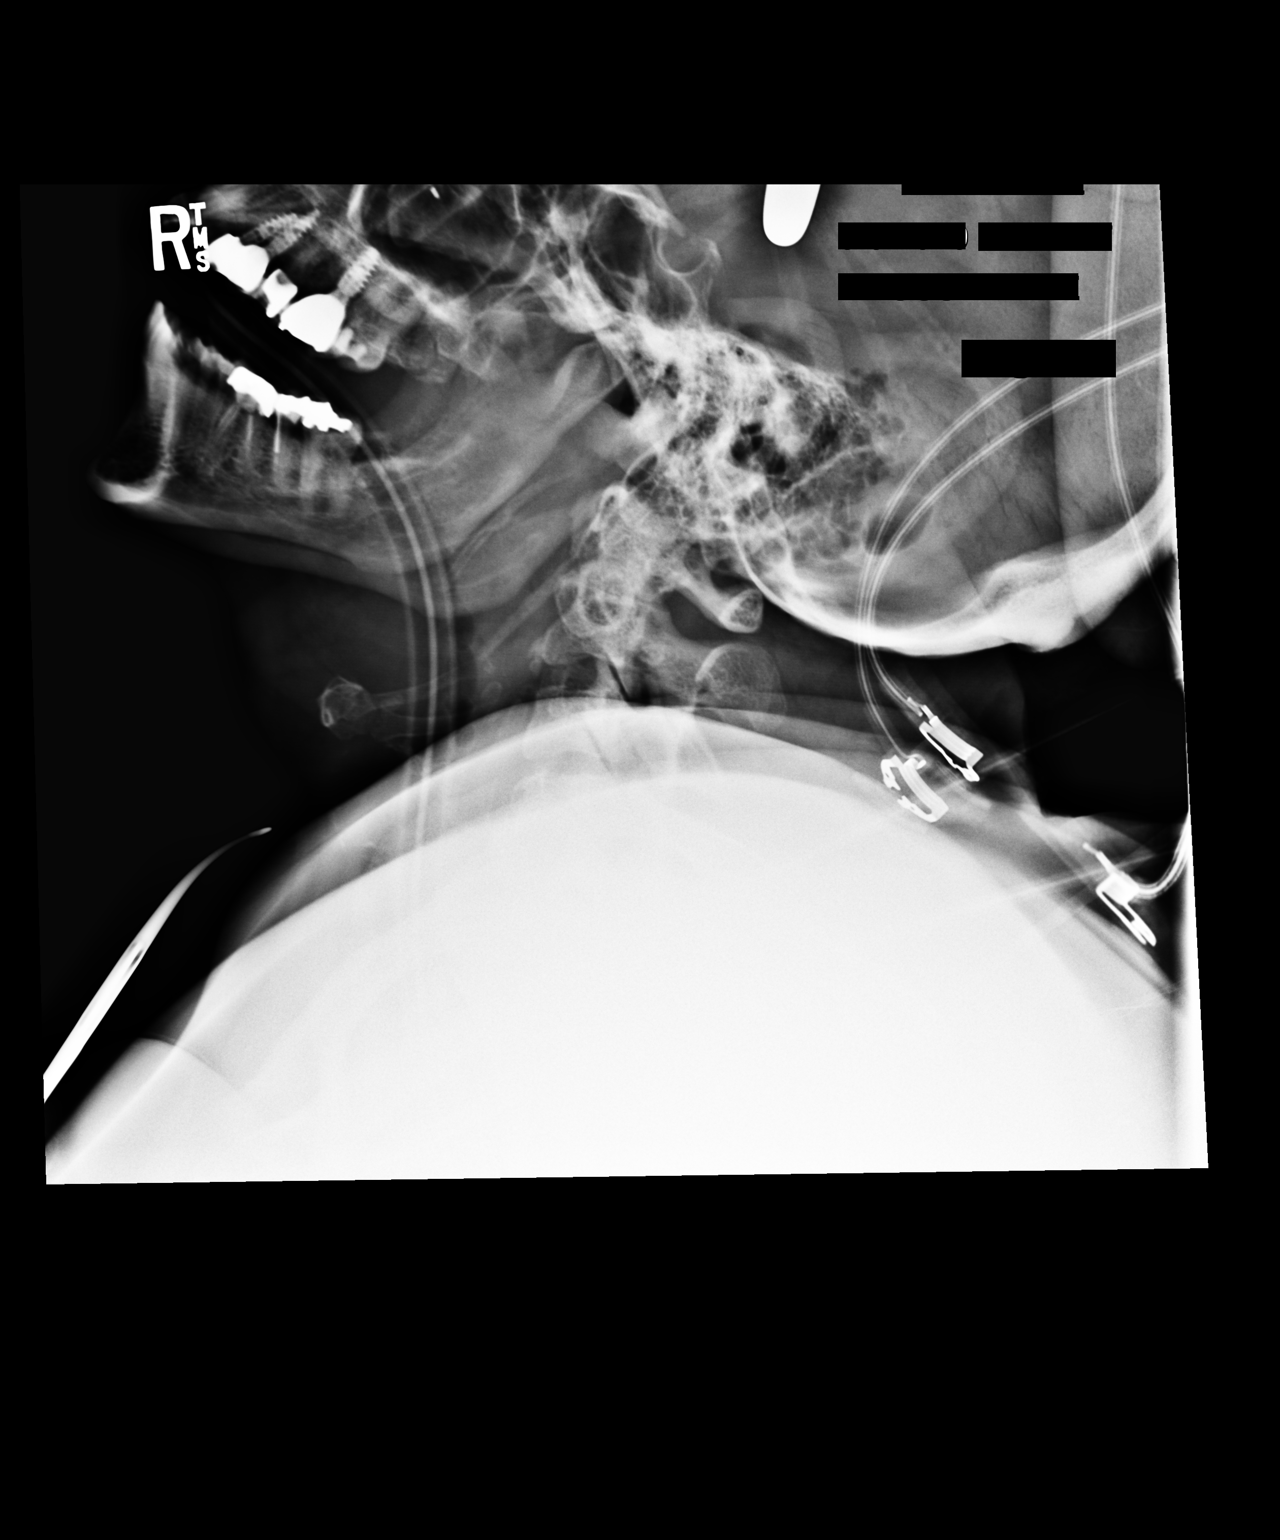

[AP (2 of 2)]
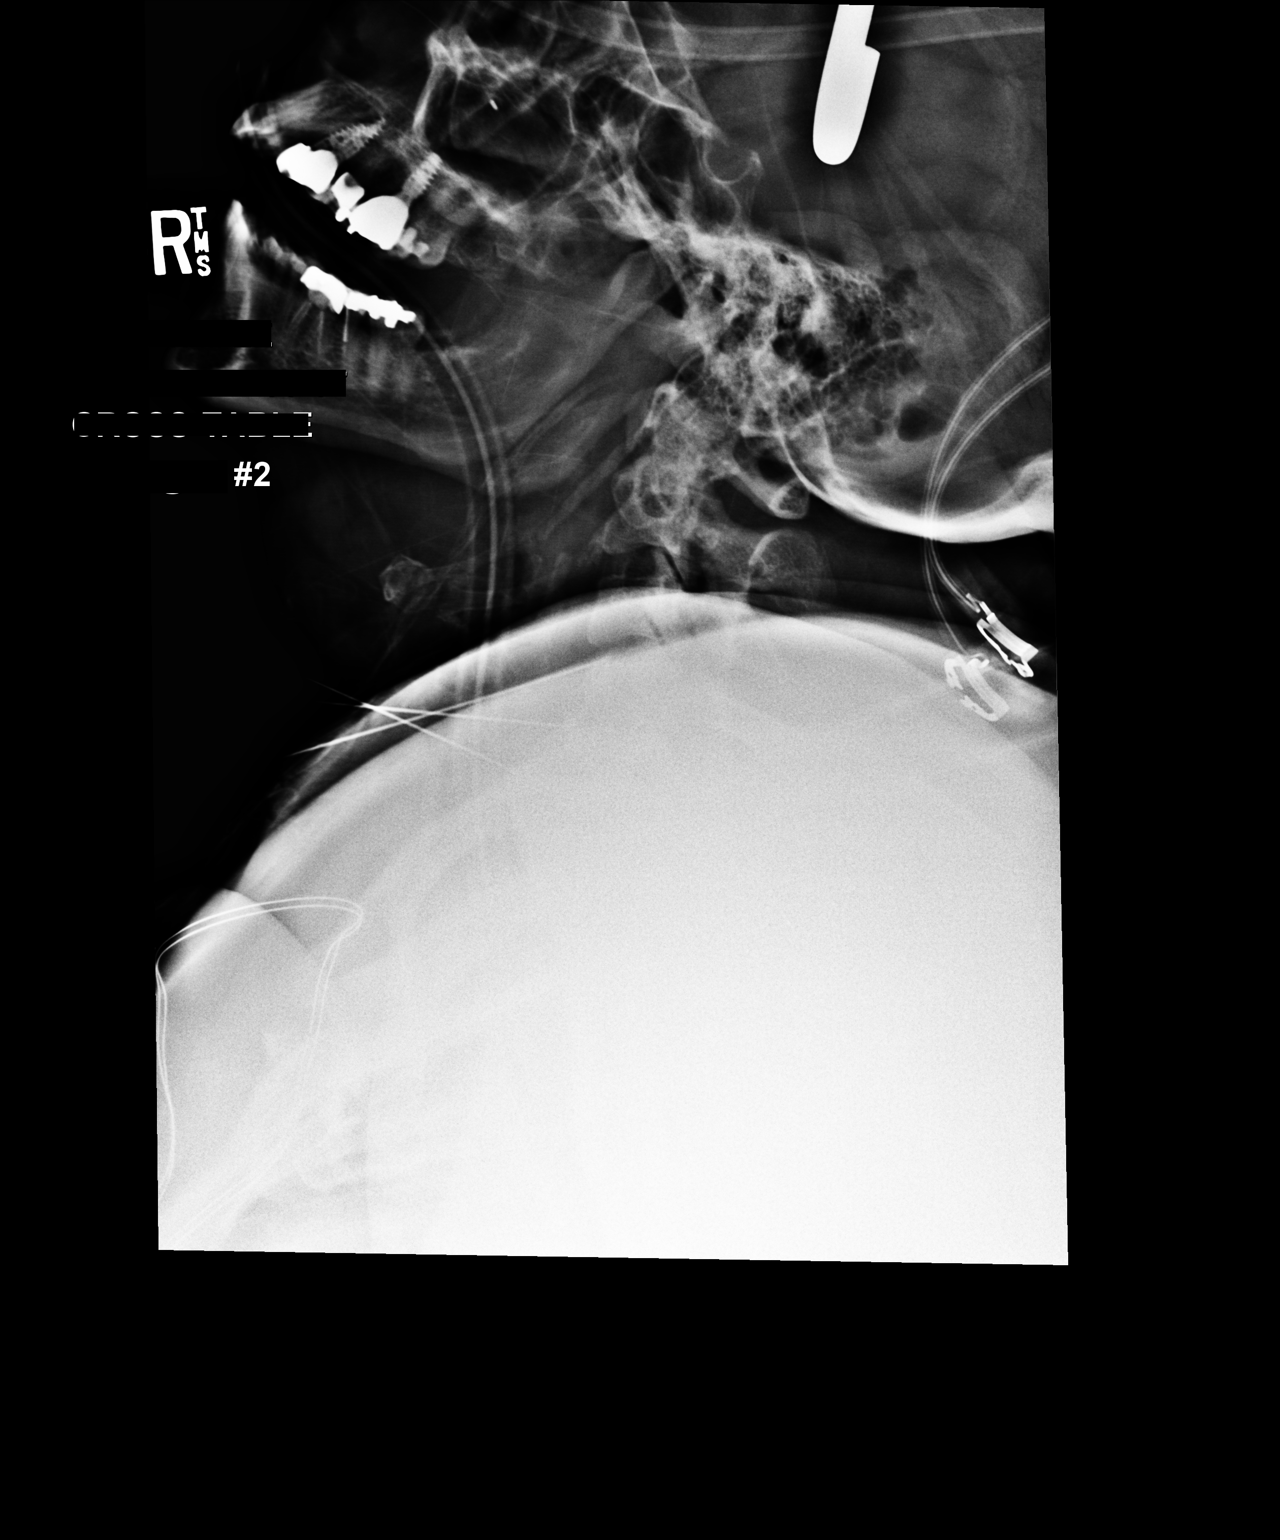

[2 of 2 positions shown; findings below may reference images not displayed]

FINDINGS: There are 3 surgical instruments overlying the anterior cervical
spine. The more superior is identified at the C3-4 disc space. The
middle instrument is approximately at the level of the C4-5 disc
space. There is limited detail at the level of the LOWER instrument.
IMPRESSION: Intraoperative localization.

## 2019-08-16 DIAGNOSIS — I2510 Atherosclerotic heart disease of native coronary artery without angina pectoris: Secondary | ICD-10-CM

## 2019-08-16 HISTORY — DX: Atherosclerotic heart disease of native coronary artery without angina pectoris: I25.10

## 2019-09-13 DIAGNOSIS — F321 Major depressive disorder, single episode, moderate: Secondary | ICD-10-CM

## 2019-09-13 DIAGNOSIS — F4323 Adjustment disorder with mixed anxiety and depressed mood: Secondary | ICD-10-CM

## 2019-09-13 DIAGNOSIS — E538 Deficiency of other specified B group vitamins: Secondary | ICD-10-CM

## 2019-09-13 DIAGNOSIS — E66813 Obesity, class 3: Secondary | ICD-10-CM

## 2019-09-13 DIAGNOSIS — R600 Localized edema: Secondary | ICD-10-CM

## 2019-09-13 DIAGNOSIS — E559 Vitamin D deficiency, unspecified: Secondary | ICD-10-CM

## 2019-09-13 HISTORY — DX: Localized edema: R60.0

## 2019-09-13 HISTORY — DX: Morbid (severe) obesity due to excess calories: E66.01

## 2019-09-13 HISTORY — DX: Major depressive disorder, single episode, moderate: F32.1

## 2019-09-13 HISTORY — DX: Vitamin D deficiency, unspecified: E55.9

## 2019-09-13 HISTORY — DX: Adjustment disorder with mixed anxiety and depressed mood: F43.23

## 2019-09-13 HISTORY — DX: Deficiency of other specified B group vitamins: E53.8

## 2019-09-13 HISTORY — DX: Obesity, class 3: E66.813

## 2020-03-31 DIAGNOSIS — R079 Chest pain, unspecified: Secondary | ICD-10-CM | POA: Diagnosis not present

## 2020-04-01 ENCOUNTER — Inpatient Hospital Stay: Admission: AD | Admit: 2020-04-01 | Payer: PRIVATE HEALTH INSURANCE | Admitting: Cardiovascular Disease

## 2020-04-01 DIAGNOSIS — E785 Hyperlipidemia, unspecified: Secondary | ICD-10-CM | POA: Diagnosis not present

## 2020-04-01 DIAGNOSIS — R079 Chest pain, unspecified: Secondary | ICD-10-CM | POA: Diagnosis not present

## 2020-04-01 DIAGNOSIS — I10 Essential (primary) hypertension: Secondary | ICD-10-CM

## 2020-04-01 DIAGNOSIS — I249 Acute ischemic heart disease, unspecified: Secondary | ICD-10-CM

## 2020-04-01 DIAGNOSIS — E119 Type 2 diabetes mellitus without complications: Secondary | ICD-10-CM | POA: Diagnosis not present

## 2020-04-02 ENCOUNTER — Encounter (HOSPITAL_COMMUNITY): Payer: Self-pay | Admitting: Anesthesiology

## 2020-04-02 ENCOUNTER — Encounter (HOSPITAL_COMMUNITY)
Admission: RE | Disposition: A | Discharge status: Home / Self Care | Payer: Self-pay | Source: Home / Self Care | Attending: Cardiology

## 2020-04-02 ENCOUNTER — Inpatient Hospital Stay (HOSPITAL_COMMUNITY)
Admission: RE | Admit: 2020-04-02 | Discharge: 2020-04-03 | DRG: 247 | Disposition: A | Discharge status: Home / Self Care | Payer: PRIVATE HEALTH INSURANCE | Attending: Cardiology | Admitting: Cardiology

## 2020-04-02 DIAGNOSIS — Z794 Long term (current) use of insulin: Secondary | ICD-10-CM

## 2020-04-02 DIAGNOSIS — I200 Unstable angina: Secondary | ICD-10-CM | POA: Diagnosis present

## 2020-04-02 DIAGNOSIS — E782 Mixed hyperlipidemia: Secondary | ICD-10-CM

## 2020-04-02 DIAGNOSIS — F418 Other specified anxiety disorders: Secondary | ICD-10-CM | POA: Diagnosis present

## 2020-04-02 DIAGNOSIS — Z955 Presence of coronary angioplasty implant and graft: Secondary | ICD-10-CM

## 2020-04-02 DIAGNOSIS — Z9119 Patient's noncompliance with other medical treatment and regimen: Secondary | ICD-10-CM

## 2020-04-02 DIAGNOSIS — E119 Type 2 diabetes mellitus without complications: Secondary | ICD-10-CM | POA: Diagnosis not present

## 2020-04-02 DIAGNOSIS — G4733 Obstructive sleep apnea (adult) (pediatric): Secondary | ICD-10-CM | POA: Diagnosis present

## 2020-04-02 DIAGNOSIS — E785 Hyperlipidemia, unspecified: Secondary | ICD-10-CM | POA: Diagnosis not present

## 2020-04-02 DIAGNOSIS — I25110 Atherosclerotic heart disease of native coronary artery with unstable angina pectoris: Principal | ICD-10-CM | POA: Diagnosis present

## 2020-04-02 DIAGNOSIS — E118 Type 2 diabetes mellitus with unspecified complications: Secondary | ICD-10-CM | POA: Diagnosis present

## 2020-04-02 DIAGNOSIS — Z20822 Contact with and (suspected) exposure to covid-19: Secondary | ICD-10-CM | POA: Diagnosis present

## 2020-04-02 DIAGNOSIS — I10 Essential (primary) hypertension: Secondary | ICD-10-CM | POA: Diagnosis present

## 2020-04-02 DIAGNOSIS — I249 Acute ischemic heart disease, unspecified: Secondary | ICD-10-CM | POA: Diagnosis not present

## 2020-04-02 HISTORY — PX: CORONARY STENT INTERVENTION: CATH118234

## 2020-04-02 HISTORY — DX: Unstable angina: I20.0

## 2020-04-02 HISTORY — DX: Type 2 diabetes mellitus with unspecified complications: E11.8

## 2020-04-02 HISTORY — DX: Mixed hyperlipidemia: E78.2

## 2020-04-02 HISTORY — PX: LEFT HEART CATH AND CORONARY ANGIOGRAPHY: CATH118249

## 2020-04-02 HISTORY — DX: Long term (current) use of insulin: Z79.4

## 2020-04-02 LAB — GLUCOSE, CAPILLARY
Glucose-Capillary: 154 mg/dL — ABNORMAL HIGH (ref 70–99)
Glucose-Capillary: 187 mg/dL — ABNORMAL HIGH (ref 70–99)
Glucose-Capillary: 182 mg/dL — ABNORMAL HIGH (ref 70–99)
Glucose-Capillary: 204 mg/dL — ABNORMAL HIGH (ref 70–99)

## 2020-04-02 LAB — POCT ACTIVATED CLOTTING TIME: Activated Clotting Time: 428 seconds

## 2020-04-02 SURGERY — LEFT HEART CATH AND CORONARY ANGIOGRAPHY
Anesthesia: LOCAL

## 2020-04-02 MED ORDER — PRAVASTATIN SODIUM 10 MG PO TABS
10.0000 mg | ORAL_TABLET | Freq: Every day | ORAL | Status: DC
  Administered 2020-04-02: 10 mg via ORAL
  Filled 2020-04-02 (×2): qty 1

## 2020-04-02 MED ORDER — VITAMIN D (ERGOCALCIFEROL) 1.25 MG (50000 UNIT) PO CAPS: 50000.0000 [IU] | ORAL_CAPSULE | ORAL | Status: DC

## 2020-04-02 MED ORDER — ALBUTEROL SULFATE (2.5 MG/3ML) 0.083% IN NEBU: 2.5000 mg | INHALATION_SOLUTION | Freq: Four times a day (QID) | RESPIRATORY_TRACT | Status: DC | PRN

## 2020-04-02 MED ORDER — PROMETHAZINE HCL 25 MG/ML IJ SOLN: 12.5000 mg | Freq: Four times a day (QID) | INTRAMUSCULAR | Status: DC | PRN

## 2020-04-02 MED ORDER — SUMATRIPTAN SUCCINATE 50 MG PO TABS: 50.0000 mg | ORAL_TABLET | ORAL | Status: DC | PRN

## 2020-04-02 MED ORDER — INSULIN ASPART 100 UNIT/ML ~~LOC~~ SOLN
0.0000 [IU] | Freq: Three times a day (TID) | SUBCUTANEOUS | Status: DC
  Administered 2020-04-02 – 2020-04-03 (×2): 2 [IU] via SUBCUTANEOUS

## 2020-04-02 MED ORDER — ONDANSETRON HCL 4 MG/2ML IJ SOLN
4.0000 mg | Freq: Four times a day (QID) | INTRAMUSCULAR | Status: DC | PRN
  Administered 2020-04-02: 4 mg via INTRAVENOUS
  Filled 2020-04-02: qty 2

## 2020-04-02 MED ORDER — CLOPIDOGREL BISULFATE 300 MG PO TABS
300.0000 mg | ORAL_TABLET | Freq: Once | ORAL | Status: AC
  Administered 2020-04-02: 300 mg via ORAL
  Filled 2020-04-02: qty 1

## 2020-04-02 MED ORDER — VITAMIN B-12 1000 MCG PO TABS
1000.0000 ug | ORAL_TABLET | Freq: Every day | ORAL | Status: DC
  Administered 2020-04-02 – 2020-04-03 (×2): 1000 ug via ORAL
  Filled 2020-04-02 (×2): qty 1

## 2020-04-02 MED ORDER — MIDAZOLAM HCL 2 MG/2ML IJ SOLN
INTRAMUSCULAR | Status: AC
  Filled 2020-04-02: qty 2

## 2020-04-02 MED ORDER — FAMOTIDINE IN NACL 20-0.9 MG/50ML-% IV SOLN
20.0000 mg | Freq: Once | INTRAVENOUS | Status: AC
  Administered 2020-04-02: 20 mg via INTRAVENOUS
  Filled 2020-04-02: qty 50

## 2020-04-02 MED ORDER — HEPARIN (PORCINE) IN NACL 1000-0.9 UT/500ML-% IV SOLN
INTRAVENOUS | Status: AC
  Filled 2020-04-02: qty 1000

## 2020-04-02 MED ORDER — TICAGRELOR 90 MG PO TABS: 90.0000 mg | ORAL_TABLET | Freq: Two times a day (BID) | ORAL | 11 refills | Status: DC

## 2020-04-02 MED ORDER — VERAPAMIL HCL 2.5 MG/ML IV SOLN
INTRAVENOUS | Status: AC
  Filled 2020-04-02: qty 2

## 2020-04-02 MED ORDER — METOPROLOL TARTRATE 12.5 MG HALF TABLET
12.5000 mg | ORAL_TABLET | Freq: Two times a day (BID) | ORAL | Status: DC
  Administered 2020-04-02: 12.5 mg via ORAL
  Filled 2020-04-02: qty 1

## 2020-04-02 MED ORDER — CLOPIDOGREL BISULFATE 75 MG PO TABS: 75.0000 mg | ORAL_TABLET | Freq: Every day | ORAL | Status: DC

## 2020-04-02 MED ORDER — SODIUM CHLORIDE 0.9% FLUSH: 3.0000 mL | Freq: Two times a day (BID) | INTRAVENOUS | Status: DC

## 2020-04-02 MED ORDER — METRONIDAZOLE 0.75 % VA GEL
1.0000 "application " | Freq: Every day | VAGINAL | Status: DC | PRN
  Filled 2020-04-02: qty 70

## 2020-04-02 MED ORDER — SODIUM CHLORIDE 0.9 % IV SOLN: 250.0000 mL | INTRAVENOUS | Status: DC | PRN

## 2020-04-02 MED ORDER — NITROGLYCERIN 1 MG/10 ML FOR IR/CATH LAB
INTRA_ARTERIAL | Status: AC
  Filled 2020-04-02: qty 10

## 2020-04-02 MED ORDER — LIDOCAINE HCL (PF) 1 % IJ SOLN
INTRAMUSCULAR | Status: AC
  Filled 2020-04-02: qty 30

## 2020-04-02 MED ORDER — TICAGRELOR 90 MG PO TABS: 90.0000 mg | ORAL_TABLET | Freq: Two times a day (BID) | ORAL | Status: DC

## 2020-04-02 MED ORDER — ASPIRIN EC 81 MG PO TBEC: 81.0000 mg | DELAYED_RELEASE_TABLET | Freq: Every day | ORAL | 11 refills | Status: DC

## 2020-04-02 MED ORDER — CLOPIDOGREL BISULFATE 75 MG PO TABS
75.0000 mg | ORAL_TABLET | Freq: Every day | ORAL | Status: DC
  Administered 2020-04-03: 75 mg via ORAL
  Filled 2020-04-02 (×2): qty 1

## 2020-04-02 MED ORDER — SODIUM CHLORIDE 0.9% FLUSH: 3.0000 mL | INTRAVENOUS | Status: DC | PRN

## 2020-04-02 MED ORDER — ALPRAZOLAM 0.25 MG PO TABS: 0.2500 mg | ORAL_TABLET | Freq: Every evening | ORAL | Status: DC | PRN

## 2020-04-02 MED ORDER — HEPARIN SODIUM (PORCINE) 1000 UNIT/ML IJ SOLN
INTRAMUSCULAR | Status: DC | PRN
  Administered 2020-04-02 (×2): 5000 [IU] via INTRAVENOUS

## 2020-04-02 MED ORDER — PRAVASTATIN SODIUM 10 MG PO TABS: 10.0000 mg | ORAL_TABLET | Freq: Every day | ORAL | 6 refills | Status: DC

## 2020-04-02 MED ORDER — TICAGRELOR 90 MG PO TABS
ORAL_TABLET | ORAL | Status: AC
  Filled 2020-04-02: qty 2

## 2020-04-02 MED ORDER — HEPARIN SODIUM (PORCINE) 1000 UNIT/ML IJ SOLN
INTRAMUSCULAR | Status: AC
  Filled 2020-04-02: qty 1

## 2020-04-02 MED ORDER — SODIUM CHLORIDE 0.9 % WEIGHT BASED INFUSION: 1.0000 mL/kg/h | INTRAVENOUS | Status: DC

## 2020-04-02 MED ORDER — HEPARIN (PORCINE) IN NACL 1000-0.9 UT/500ML-% IV SOLN
INTRAVENOUS | Status: DC | PRN
  Administered 2020-04-02 (×2): 500 mL

## 2020-04-02 MED ORDER — LOSARTAN POTASSIUM 25 MG PO TABS
25.0000 mg | ORAL_TABLET | Freq: Every day | ORAL | Status: DC
  Administered 2020-04-02 – 2020-04-03 (×2): 25 mg via ORAL
  Filled 2020-04-02 (×2): qty 1

## 2020-04-02 MED ORDER — FLUOXETINE HCL 20 MG PO CAPS
20.0000 mg | ORAL_CAPSULE | ORAL | Status: DC
  Administered 2020-04-03: 20 mg via ORAL
  Filled 2020-04-02 (×2): qty 1

## 2020-04-02 MED ORDER — TICAGRELOR 90 MG PO TABS
ORAL_TABLET | ORAL | Status: DC | PRN
  Administered 2020-04-02: 180 mg via ORAL

## 2020-04-02 MED ORDER — PANTOPRAZOLE SODIUM 40 MG PO TBEC
40.0000 mg | DELAYED_RELEASE_TABLET | Freq: Every day | ORAL | Status: DC
  Administered 2020-04-02 – 2020-04-03 (×2): 40 mg via ORAL
  Filled 2020-04-02 (×2): qty 1

## 2020-04-02 MED ORDER — MIDAZOLAM HCL 2 MG/2ML IJ SOLN
INTRAMUSCULAR | Status: DC | PRN
  Administered 2020-04-02: 2 mg via INTRAVENOUS
  Administered 2020-04-02 (×2): 1 mg via INTRAVENOUS

## 2020-04-02 MED ORDER — FENTANYL CITRATE (PF) 100 MCG/2ML IJ SOLN
INTRAMUSCULAR | Status: DC | PRN
  Administered 2020-04-02 (×3): 25 ug via INTRAVENOUS

## 2020-04-02 MED ORDER — VERAPAMIL HCL 2.5 MG/ML IV SOLN
INTRAVENOUS | Status: DC | PRN
  Administered 2020-04-02: 10 mL via INTRA_ARTERIAL

## 2020-04-02 MED ORDER — LOSARTAN POTASSIUM 25 MG PO TABS: 25.0000 mg | ORAL_TABLET | Freq: Every day | ORAL | Status: DC

## 2020-04-02 MED ORDER — NITROGLYCERIN 1 MG/10 ML FOR IR/CATH LAB
INTRA_ARTERIAL | Status: DC | PRN
  Administered 2020-04-02 (×2): 200 ug via INTRACORONARY

## 2020-04-02 MED ORDER — CLOPIDOGREL BISULFATE 300 MG PO TABS: 300.0000 mg | ORAL_TABLET | Freq: Once | ORAL | Status: DC

## 2020-04-02 MED ORDER — ACETAMINOPHEN 325 MG PO TABS
650.0000 mg | ORAL_TABLET | ORAL | Status: DC | PRN
  Administered 2020-04-02: 650 mg via ORAL
  Filled 2020-04-02 (×2): qty 2

## 2020-04-02 MED ORDER — NITROGLYCERIN 0.4 MG SL SUBL: 0.4000 mg | SUBLINGUAL_TABLET | SUBLINGUAL | 3 refills | Status: DC | PRN

## 2020-04-02 MED ORDER — LIDOCAINE HCL (PF) 1 % IJ SOLN
INTRAMUSCULAR | Status: DC | PRN
  Administered 2020-04-02: 2 mL via INTRADERMAL

## 2020-04-02 MED ORDER — FENTANYL CITRATE (PF) 100 MCG/2ML IJ SOLN
INTRAMUSCULAR | Status: AC
  Filled 2020-04-02: qty 2

## 2020-04-02 MED ORDER — HYDRALAZINE HCL 20 MG/ML IJ SOLN
10.0000 mg | INTRAMUSCULAR | Status: AC | PRN
  Administered 2020-04-02: 10 mg via INTRAVENOUS
  Filled 2020-04-02: qty 1

## 2020-04-02 MED ORDER — ASPIRIN 81 MG PO CHEW
81.0000 mg | CHEWABLE_TABLET | Freq: Every day | ORAL | Status: DC
  Administered 2020-04-03: 81 mg via ORAL
  Filled 2020-04-02: qty 1

## 2020-04-02 MED FILL — ASPIRIN LOW DOSE 81 MG TBEC: 81 | 30 days supply | Qty: 30 | Fill #0

## 2020-04-02 MED FILL — PRAVASTATIN NA 10 MG TAB: 10 | 30 days supply | Qty: 30 | Fill #0

## 2020-04-02 MED FILL — NITROGLYCERIN 0.4 MG TAB SL: 0.4 | 8 days supply | Qty: 25 | Fill #0

## 2020-04-02 SURGICAL SUPPLY — 20 items
BALLN SAPPHIRE 2.5X15 (BALLOONS) ×2
BALLN SAPPHIRE ~~LOC~~ 3.0X18 (BALLOONS) ×1 IMPLANT
BALLOON SAPPHIRE 2.5X15 (BALLOONS) IMPLANT
CATH 5FR JL3.5 JR4 ANG PIG MP (CATHETERS) ×1 IMPLANT
CATH LAUNCHER 6FR EBU3.5 (CATHETERS) ×1 IMPLANT
DEVICE RAD COMP TR BAND LRG (VASCULAR PRODUCTS) ×1 IMPLANT
GLIDESHEATH SLEND SS 6F .021 (SHEATH) ×1 IMPLANT
GUIDEWIRE INQWIRE 1.5J.035X260 (WIRE) IMPLANT
INQWIRE 1.5J .035X260CM (WIRE) ×2
KIT ENCORE 26 ADVANTAGE (KITS) ×1 IMPLANT
KIT HEART LEFT (KITS) ×2 IMPLANT
PACK CARDIAC CATHETERIZATION (CUSTOM PROCEDURE TRAY) ×2 IMPLANT
SHEATH PROBE COVER 6X72 (BAG) ×1 IMPLANT
STENT SYNERGY XD 2.75X32 (Permanent Stent) IMPLANT
SYNERGY XD 2.75X32 (Permanent Stent) ×2 IMPLANT
SYR MEDRAD MARK 7 150ML (SYRINGE) ×2 IMPLANT
TRANSDUCER W/STOPCOCK (MISCELLANEOUS) ×2 IMPLANT
TUBING CIL FLEX 10 FLL-RA (TUBING) ×2 IMPLANT
WIRE ASAHI PROWATER 180CM (WIRE) ×1 IMPLANT
WIRE HI TORQ VERSACORE-J 145CM (WIRE) ×1 IMPLANT

## 2020-04-02 NOTE — Progress Notes (Signed)
Patient seen in transfer from [facility] for cardiac cath.   Complete consultation/H&P is reviewed from [facility] and will be scanned into record.   Patient admitted with chest pain concerning for unstable angina. CTA negative for PE.  Plans for cardiac cath today. The procedure and risks were reviewed including but not limited to death, myocardial infarction, stroke, arrythmias, bleeding, transfusion, emergency surgery, dye allergy, or renal dysfunction. The patient voices understanding and is agreeable to proceed.Marland Kitchen  Derryl Uher [place] MD, [facility]  04/02/2020 10:34 AM

## 2020-04-02 NOTE — Progress Notes (Signed)
Received Phyllis Delgado from [facility] via [facility] alert and oriented X4, skin warm and dry, denies any CP or discomfort at this time.  Consent signed, Phyllis Delgado placed on monitor, and IVF infusing.  Dr Liberty Handy in to see Phyllis Delgado.  Phyllis Delgado to procedure area for cath.

## 2020-04-02 NOTE — Interval H&P Note (Signed)
History and Physical Interval Note:  04/02/2020 10:35 AM  Phyllis Delgado  has presented today for surgery, with the diagnosis of acute coronary syndome.  The various methods of treatment have been discussed with the patient and family. After consideration of risks, benefits and other options for treatment, the patient has consented to  Procedure(s): LEFT HEART CATH AND CORONARY ANGIOGRAPHY (N/A) as a surgical intervention.  The patient's history has been reviewed, patient examined, no change in status, stable for surgery.  I have reviewed the patient's chart and labs.  Questions were answered to the patient's satisfaction.   Cath Lab Visit (complete for each Cath Lab visit)  Clinical Evaluation Leading to the Procedure:   ACS: Yes.    Non-ACS:    Anginal Classification: CCS IV  Anti-ischemic medical therapy: Minimal Therapy (1 class of medications)  Non-Invasive Test Results: No non-invasive testing performed  Prior CABG: No previous CABG        Collier Salina [facility] 04/02/2020 10:35 AM

## 2020-04-02 NOTE — H&P (View-Only) (Signed)
Patient seen in transfer from [facility] for cardiac cath.   Complete consultation/H&P is reviewed from [facility] and will be scanned into record.   Patient admitted with chest pain concerning for unstable angina. CTA negative for PE.  Plans for cardiac cath today. The procedure and risks were reviewed including but not limited to death, myocardial infarction, stroke, arrythmias, bleeding, transfusion, emergency surgery, dye allergy, or renal dysfunction. The patient voices understanding and is agreeable to proceed.Marland Kitchen  Nashika Coker [place] MD, [facility]  04/02/2020 10:34 AM

## 2020-04-02 NOTE — Discharge Instructions (Signed)
DRINK PLENTY OF FLUIDS FOR THE NEXT 2-3 DAYS.  KEEP ARM ELEVATED THE REMAINDER OF THE DAY.  HOLD METFORMIN FOR A FULL 48 HOURS AFTER DISCHARGE.   Radial Site Care  This sheet gives you information about how to care for yourself after your procedure. Your health care provider may also give you more specific instructions. If you have problems or questions, contact your health care provider. What can I expect after the procedure? After the procedure, it is common to have:  Bruising and tenderness at the catheter insertion area. Follow these instructions at home: Medicines  Take over-the-counter and prescription medicines only as told by your health care provider. Insertion site care 1. Follow instructions from your health care provider about how to take care of your insertion site. Make sure you: ? Wash your hands with soap and water before you change your bandage (dressing). If soap and water are not available, use hand sanitizer. ? Change your dressing as told by your health care provider. 2. Check your insertion site every day for signs of infection. Check for: ? Redness, swelling, or pain. ? Fluid or blood. ? Pus or a bad smell. ? Warmth. 3. Do not take baths, swim, or use a hot tub for 5 days. 4. You may shower 24-48 hours after the procedure. ? Remove the dressing and gently wash the site with plain soap and water. ? Pat the area dry with a clean towel. ? Do not rub the site. That could cause bleeding. 5. Do not apply powder or lotion to the site. Activity  1. For 24 hours after the procedure, or as directed by your health care provider: ? Do not flex or bend the affected arm. ? Do not push or pull heavy objects with the affected arm. ? Do not drive yourself home from the hospital or clinic. You may drive 24 hours after the procedure. ? Do not operate machinery or power tools. 2. Do not push, pull or lift anything that is heavier than 10 lb for 5 days. 3. Ask your health  care provider when it is okay to: ? Return to work or school. ? Resume usual physical activities or sports. ? Resume sexual activity. General instructions  If the catheter site starts to bleed, raise your arm and put firm pressure on the site. If the bleeding does not stop, get help right away. This is a medical emergency.  If you went home on the same day as your procedure, a responsible adult should be with you for the first 24 hours after you arrive home.  Keep all follow-up visits as told by your health care provider. This is important. Contact a health care provider if:  You have a fever.  You have redness, swelling, or yellow drainage around your insertion site. Get help right away if:  You have unusual pain at the radial site.  The catheter insertion area swells very fast.  The insertion area is bleeding, and the bleeding does not stop when you hold steady pressure on the area.  Your arm or hand becomes pale, cool, tingly, or numb. These symptoms may represent a serious problem that is an emergency. Do not wait to see if the symptoms will go away. Get medical help right away. Call your local emergency services (911 in the [place] Do not drive yourself to the hospital. Summary  After the procedure, it is common to have bruising and tenderness at the site.  Follow instructions from your health care provider  about how to take care of your radial site wound. Check the wound every day for signs of infection.  Do not push, pull or lift anything that is heavier than 10 lb for 5 days.  This information is not intended to replace advice given to you by your health care provider. Make sure you discuss any questions you have with your health care provider. Document Revised: 09/06/2017 Document Reviewed: 09/06/2017 Elsevier Patient Education  2020 [employer].

## 2020-04-02 NOTE — Progress Notes (Signed)
Pt complaints of chest pain 5/10 mid chest. Nausea/ vomiting. Diaphoretic. BP 167/112, HR 84, 99% on RA. PA notified. EKG done, hydralazine given, zofran given per MD order.

## 2020-04-02 NOTE — Progress Notes (Signed)
Asked to see patient in short stay for n/v, SOB and chest discomfort post-cath.  Paper chart reviewed. [name] is a 49 y/o MNIDDM, psoriatic arthritis, mild OSA (does not wear CPPA), obesity, remote atrial fib 2017 not on anticoagulation, anxiety/depression, asthma, GERD, migraines. She presented to [facility] 03/31/20 with CP radiating to her arms. 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 Per notes she went to see her doctor who she works for and was told her high sensitivity troponin was mildly elevated at 26. At [facility] she had negative troponins. She was hypertensive. CXR NAD. Yesterday she was seen by Dr. Bettina Gavia and underwent a nuclear perfusion study. His note indicates he had to give her IV Lopressor in recovery for HTN and tachycardia. NST showed no reversible ischemia, decreased counts in the anterior septal wall favored breast attenuation, cannot exclude scar but no wall motion abnormality argued against prior infarct, EF 64%. Given symptoms, Dr. Bettina Gavia felt she required cath so sent her down to [facility] for cath.  Other records from OSH include:   CXR 03/31/20 no active cardiopulmonary disease is radiographically apparent, thoracic spondylosis  03/31/20 WBC 8.7, Hgb 12.8, Hct 38.0, Plt 350, Na 137, K 3.9, BUN 13, Cr 0.70, glucose 138, LFTs wnl, INR 0.9 Troponins negative  04/01/20 Na 135, K 4.0, Cr 0.60, Tchol 223, LDL 129.4, HDL 36, Trig 288, Covid negative   04/01/20 CT angio - no PE or other acute thoracic abnormality, + flowing ventral osteophytosis along the length of the thoracic spine, c/w diffuse idiopathic skeletal hyperostosis  She underwent cath earlier today with findings as outlined with 95% OM1. With balloon inflation the vessel abruptly occluded and the patient developed acute crushing chest pain. The lesion was then quickly stented with a 2.75 x 32 mm Synergy stent with excellent restoration of flow.   She has remained hemodynamically stable in  short stay albeit hypertensive at times. She developed a sensation earlier as if she had her breath taken away/breathing through a straw, followed by nausea/emesis and chest discomfort. She is not tachycardic, tachypneic or hypoxic. She described the chest discomfort as an electric shock feeling across her chest, dissimilar from what brought her into the hospital. EKG shows NSR with subtle nonspecific STT changes V1-V4 but no marked difference from post-procedure EKG. Patient was given O2, hydralazine, and Zofran by short stay nursing staff. Chest pain was relieved with these measures. She continues to have some nausea/vomiting so we will try IV Pepcid with offer IV Phenergan. Nurse did not observe Brilinta pills in emesis. Discussed the above with Dr. Martinique who feels she should remain in-house for observation than be discharged. He feels enough time has passed since cath to avoid needing to re-load with Brilinta acutely - however, given her SOB with this medicine, he recommends to switch to Plavix by loading with 300mg  around 5pm and then beginning 75mg  daily. She is also likely to require additional HTN control based on BP trends this admission so Dr. Martinique recommends initiation of losartan 25mg  daily (chosen due to DM). She has been admitted since 8/17 to OSH so will be under inpatient status. Patient agreeable to staying.  To APP team tomorrow: Patient was originally identified as a potential same-day discharge so Brilinta, ASA, pravastatin, and SL NTG were sent to [facility]. We have now discontinued Brilinta and TOC pharmacy is aware. Anticipate further rx's forthcoming with Plavix and losartan but instead of sending in, will need to wait until finalized discharge  med plan to avoid having to retract again. The [place] team actually took back her 4 meds back downstairs to avoid confusion and will plan to deliver her final regimen at discharge.  Gresham Caetano PA-C

## 2020-04-02 NOTE — Progress Notes (Signed)
Discussed stent, restrictions, Brilinta, diet, exercise, NTG and CRPII. Pt receptive. Understands importance of Brilinta. Eager to begin exercise. Will refer to [place].  [place], ACSM 1:29 PM 04/02/2020

## 2020-04-02 NOTE — Progress Notes (Signed)
Report given to [place].

## 2020-04-03 ENCOUNTER — Encounter (HOSPITAL_COMMUNITY): Payer: Self-pay | Admitting: Cardiology

## 2020-04-03 DIAGNOSIS — I200 Unstable angina: Secondary | ICD-10-CM

## 2020-04-03 LAB — BASIC METABOLIC PANEL
Anion gap: 10 (ref 5–15)
BUN: 8 mg/dL (ref 6–20)
CO2: 21 mmol/L — ABNORMAL LOW (ref 22–32)
Calcium: 9 mg/dL (ref 8.9–10.3)
Chloride: 105 mmol/L (ref 98–111)
Creatinine, Ser: 0.76 mg/dL (ref 0.44–1.00)
GFR calc Af Amer: >60 mL/min (ref >60)
GFR calc non Af Amer: >60 mL/min (ref >60)
Glucose, Bld: 166 mg/dL — ABNORMAL HIGH (ref 70–99)
Potassium: 3.6 mmol/L (ref 3.5–5.1)
Sodium: 136 mmol/L (ref 135–145)

## 2020-04-03 LAB — CBC
HCT: 41.2 % (ref 36.0–46.0)
Hemoglobin: 13.3 g/dL (ref 12.0–15.0)
MCH: 29 pg (ref 26.0–34.0)
MCHC: 32.3 g/dL (ref 30.0–36.0)
MCV: 90 fL (ref 80.0–100.0)
Platelets: 361 10*3/uL (ref 150–400)
RBC: 4.58 MIL/uL (ref 3.87–5.11)
RDW: 14.8 % (ref 11.5–15.5)
WBC: 8.5 10*3/uL (ref 4.0–10.5)
nRBC: 0 % (ref 0.0–0.2)

## 2020-04-03 LAB — GLUCOSE, CAPILLARY
Glucose-Capillary: 156 mg/dL — ABNORMAL HIGH (ref 70–99)
Glucose-Capillary: 158 mg/dL — ABNORMAL HIGH (ref 70–99)

## 2020-04-03 MED ORDER — PANTOPRAZOLE SODIUM 40 MG PO TBEC: 40.0000 mg | DELAYED_RELEASE_TABLET | Freq: Every day | ORAL | 3 refills | Status: AC

## 2020-04-03 MED ORDER — METOPROLOL TARTRATE 25 MG PO TABS
25.0000 mg | ORAL_TABLET | Freq: Two times a day (BID) | ORAL | Status: DC
  Administered 2020-04-03: 25 mg via ORAL
  Filled 2020-04-03: qty 1

## 2020-04-03 MED ORDER — ASPIRIN 81 MG PO CHEW: 81.0000 mg | CHEWABLE_TABLET | Freq: Every day | ORAL | Status: AC

## 2020-04-03 MED ORDER — LOSARTAN POTASSIUM 25 MG PO TABS: 25.0000 mg | ORAL_TABLET | Freq: Every day | ORAL | 3 refills | Status: DC

## 2020-04-03 MED ORDER — CLOPIDOGREL BISULFATE 75 MG PO TABS: 75.0000 mg | ORAL_TABLET | Freq: Every day | ORAL | 3 refills | Status: DC

## 2020-04-03 MED ORDER — PRAVASTATIN SODIUM 10 MG PO TABS: 10.0000 mg | ORAL_TABLET | Freq: Every day | ORAL | 3 refills | Status: DC

## 2020-04-03 MED ORDER — IOHEXOL 350 MG/ML SOLN
INTRAVENOUS | Status: DC | PRN
  Administered 2020-04-02: 165 mL via INTRA_ARTERIAL

## 2020-04-03 MED FILL — CLOPIDOGREL 75 MG TABLET: 75 | 30 days supply | Qty: 30 | Fill #0

## 2020-04-03 MED FILL — LOSARTAN POTASSIUM 25 MG TA: 25 | 30 days supply | Qty: 30 | Fill #0

## 2020-04-03 MED FILL — PANTOPRAZOLE SOD DR 40 MG T: 40 | 30 days supply | Qty: 30 | Fill #0

## 2020-04-03 NOTE — Progress Notes (Signed)
CARDIAC REHAB PHASE I   PRE:  Rate/Rhythm: 94 SR    BP: sitting 138/79    SaO2:   MODE:  Ambulation: 650 ft   POST:  Rate/Rhythm: 121 ST    BP: sitting 147/101     SaO2:   Pt feeling much better this am. Able to walk without sx like yesterday. HR and BP elevated with ambulation, does st slight SOB due to HR. Has not had meds yet, notified RN. No questions. [place], ACSM 04/03/2020 8:49 AM

## 2020-04-03 NOTE — Progress Notes (Addendum)
Progress Note  Patient Name: Phyllis Delgado Date of Encounter: 04/03/2020  Primary Cardiologist: Dr. Kyra Searles   Subjective   Feeling much better today. No recurrent chest pain/N/V or SOB overnight. Tolerating Plavix so far. Cath site stable   Inpatient Medications    Scheduled Meds: . aspirin  81 mg Oral Daily  . clopidogrel  75 mg Oral Daily  . FLUoxetine  20 mg Oral Q M,W,F  . insulin aspart  0-9 Units Subcutaneous TID WC  . losartan  25 mg Oral Daily  . metoprolol tartrate  12.5 mg Oral BID  . pantoprazole  40 mg Oral Daily  . pravastatin  10 mg Oral q1800  . sodium chloride flush  3 mL Intravenous Q12H  . vitamin B-12  1,000 mcg Oral Daily  . [START ON 04/13/2020] Vitamin D (Ergocalciferol)  50,000 Units Oral Weekly   Continuous Infusions: . sodium chloride     PRN Meds: sodium chloride, acetaminophen, albuterol, ALPRAZolam, metroNIDAZOLE, ondansetron (ZOFRAN) IV, promethazine, sodium chloride flush   Vital Signs    Vitals:   04/02/20 1500 04/02/20 1636 04/02/20 2046 04/03/20 0400  BP: 125/60 (!) 154/89 (!) 142/99 138/90  Pulse: 73 85 (!) 105 87  Resp:  17 17 16   Temp:  (!) 97.4 F (36.3 C) 98.1 F (36.7 C) 98.2 F (36.8 C)  TempSrc:  Oral Oral Oral  SpO2: 100% 100% 100% 98%  Weight:    99.3 kg    Intake/Output Summary (Last 24 hours) at 04/03/2020 0651 Last data filed at 04/03/2020 0412 Gross per 24 hour  Intake 360 ml  Output 1100 ml  Net -740 ml   Filed Weights   04/02/20 1225 04/03/20 0400  Weight: 98.9 kg 99.3 kg    Physical Exam   General: Overweight, NAD Neck: Negative for carotid bruits. No JVD Lungs:Clear to ausculation bilaterally. No wheezes. Breathing is unlabored. Cardiovascular: RRR with S1 S2. No murmurs Abdomen: Soft, non-tender, non-distended. No obvious abdominal masses. Extremities: No edema. Radial pulses 2+ bilaterally Neuro: Alert and oriented. No focal deficits. No facial asymmetry. MAE spontaneously. Psych:  Responds to questions appropriately with normal affect.    Labs    Chemistry Recent Labs  Lab 04/03/20 0441  NA 136  K 3.6  CL 105  CO2 21*  GLUCOSE 166*  BUN 8  CREATININE 0.76  CALCIUM 9.0  GFRNONAA >60  GFRAA >60  ANIONGAP 10     Hematology Recent Labs  Lab 04/03/20 0441  WBC 8.5  RBC 4.58  HGB 13.3  HCT 41.2  MCV 90.0  MCH 29.0  MCHC 32.3  RDW 14.8  PLT 361    Cardiac EnzymesNo results for input(s): TROPONINI in the last 168 hours. No results for input(s): TROPIPOC in the last 168 hours.   BNPNo results for input(s): BNP, PROBNP in the last 168 hours.   DDimer No results for input(s): DDIMER in the last 168 hours.   Radiology    CARDIAC CATHETERIZATION  Result Date: 04/02/2020  1st Mrg lesion is 95% stenosed.  Post intervention, there is a 0% residual stenosis.  A drug-eluting stent was successfully placed using a SYNERGY XD 2.75X32.  The left ventricular systolic function is normal.  LV end diastolic pressure is normal.  The left ventricular ejection fraction is 55-65% by visual estimate.  1. Single vessel obstructive CAD involving a large first OM 2. Normal LV function 3. Normal LVEDP 4. Successful PCI of the first OM with DES x 1. Plan:  DAPT for one year. Patient has a history of intolerance to statins. Will try pravastatin 10 mg daily. Anticipate same day DC.   Telemetry    04/03/20 NSR- Personally Reviewed  ECG    No new tracing as of 04/03/20 - Personally Reviewed  Cardiac Studies   LHC 04/02/2020:   1st Mrg lesion is 95% stenosed.  Post intervention, there is a 0% residual stenosis.  A drug-eluting stent was successfully placed using a SYNERGY XD 2.75X32.  The left ventricular systolic function is normal.  LV end diastolic pressure is normal.  The left ventricular ejection fraction is 55-65% by visual estimate.   1. Single vessel obstructive CAD involving a large first OM 2. Normal LV function  3. Normal LVEDP 4. Successful PCI  of the first OM with DES x 1.   Plan: DAPT for one year. Patient has a history of intolerance to statins. Will try pravastatin 10 mg daily. Anticipate same day DC.   Patient Profile     49 y.o. female MNIDDM, psoriatic arthritis, mild OSA (does not wear CPPA), obesity, remote atrial fib 2017 not on anticoagulation, anxiety/depression, asthma, GERD, migraines.   Assessment & Plan    1. CAD s/p successful first OM with DES x 1: -Pt found to have a 95% OM1 lesion with PCI placement x1 04/02/20 -Plan was for same day PCI discharge however patient developed chest discomfort with associated shortness of breath and nausea and vomiting with emesis. EKG with no significant changes per rounding APP. Case discussed with Dr. Martinique, interventional cardiologist who preferred that the patient remain overnight with plans to transition off Brilinta with Plavix load at 300 mg at 5 PM on 04/02/2020 and start 75 mg daily today. -Denies recurrent symptoms today  -Continue ASA, Lopressor 12.5, pravastatin, Plavix  2. DM2: -SSI for glucose control inpatient status -Plan to resume PTA Metformin tomorrow, 04/04/2020  3. Mild OSA: -Noncompliant with CPAP  4. Remote AF in 2017 (not on anticoagulation): -Not on anticoagulation with no recurrence -Maintaining NSR  5.  Hypertension: -Stable, 138/90>142/99>154/89 -Losartan 25 daily added to regimen -Will up-titrate Lopressor to 25 given persistently elevated BP's and HR in the 90-100's    Signed, Kathyrn Drown NP-C [place] Pager: [phone] 04/03/2020, 6:51 AM     For questions or updates, please contact   Please consult [email or web address] for contact info under Cardiology/STEMI.  Patient seen, examined. Available data reviewed. Agree with findings, assessment, and plan as outlined by Kathyrn Drown, NP-C.  The patient is independently interviewed and examined.  She is alert, oriented, obese woman in no distress.  She is sitting up in a chair at the bedside.   Lung fields are clear, heart is regular rate and rhythm with no murmur gallop, abdomen is soft and nontender, extremities have no edema.  The patient has done well after PCI performed yesterday for treatment of unstable angina.  She was transition from Brilinta to clopidogrel because of shortness of breath.  She is doing much better this morning.  We reviewed post PCI instructions today.  I reviewed her medical program and agree with increasing metoprolol tartrate for elevated blood pressure.  The patient has a history of statin intolerance and she cannot take a high intensity statin drug.  She will be tried on low-dose pravastatin.  She should be considered for PCSK9 inhibitor in the outpatient setting.  The patient is medically stable for discharge today.  I advised her that she can return to work without restrictions on Monday, August  [place]  Sherren Mocha, M.D. 04/03/2020 10:03 AM

## 2020-04-03 NOTE — Discharge Summary (Signed)
Discharge Summary    Patient ID: Phyllis Delgado MRN: [id number]; DOB: [birth year]  Admit date: 04/02/2020 Discharge date: 04/03/2020  Primary Care Provider: Nicoletta Dress, MD  Primary Cardiologist: Phyllis More, MD   Discharge Diagnoses    Principal Problem:   Unstable angina [facility]) Active Problems:   Hyperlipidemia, mixed   Type 2 diabetes mellitus with complication, with long-term current use of insulin [facility])  Diagnostic Studies/Procedures    LHC 04/02/2020:   1st Mrg lesion is 95% stenosed.  Post intervention, there is a 0% residual stenosis.  A drug-eluting stent was successfully placed using a SYNERGY XD 2.75X32.  The left ventricular systolic function is normal.  LV end diastolic pressure is normal.  The left ventricular ejection fraction is 55-65% by visual estimate.  1. Single vessel obstructive CAD involving a large first OM 2. Normal LV function  3. Normal LVEDP 4. Successful PCI of the first OM with DES x 1.   Plan: DAPT for one year. Patient has a history of intolerance to statins. Will try pravastatin 10 mg daily. Anticipate same day DC.  History of Present Illness     Phyllis Delgado is a 49 y.o. female with MNIDDM, psoriatic arthritis, mild OSA (does not wear CPPA), obesity, remote atrial fib 2017 not on anticoagulation, anxiety/depression, asthma, GERD, migraines.   Patient seen in transfer from [facility] for cardiac cath.   Paper chart reviewed. Phyllis Delgado presented to [facility] 03/31/20 with CP radiating to her arms. 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 Per notes she went to see her doctor who she works for and was told her high sensitivity troponin was mildly elevated at 26. At [facility] she had negative troponins. She was hypertensive. CXR NAD. She was then evaluated by Phyllis Delgado and underwent a nuclear perfusion study. His note indicated he had to give her IV Lopressor in recovery for HTN and  tachycardia. NST showed no reversible ischemia, decreased counts in the anterior septal wall favored breast attenuation, cannot exclude scar but no wall motion abnormality argued against prior infarct, EF 64%. Given symptoms, Phyllis Delgado felt she required cath therefore she was transferred to [facility] for [place].    Hospital Course   She underwent cath 04/02/20 with findings as outlined with 95% OM1. With balloon inflation the vessel abruptly occluded and the patient developed acute crushing chest pain. The lesion was then quickly stented with a 2.75 x 32 mm Synergy stent with excellent restoration of flow.   She remained hemodynamically stable in short stay however developed a sensation post cath as if she had her breath taken away/breathing through a straw, followed by nausea/emesis and chest discomfort. EKG showed NSR with subtle nonspecific STT changes V1-V4 but no marked difference from post-procedure EKG. She was given O2, hydralazine, and Zofran by short stay nursing staff. Chest pain was relieved Issues discussed with Phyllis Delgado who felt she should remain in-house for observation overnight rather than be discharged. Brilinta was stopped and she was loaded with Plavix 300mg  around 5pm and then beginning 75mg  daily on day of dicharge. Losartan 25mg  daily (chosen due to DM) was added for HTN control.   CAD s/p successful first OM with DES x 1: -Pt found to have a 95% OM1 lesion with PCI placement x1 04/02/20 -Plan was for same day PCI discharge however patient developed chest discomfort with associated shortness of breath and nausea and vomiting with emesis. EKG with no significant changes per rounding APP.  Case discussed with Phyllis Delgado, interventional cardiologist who preferred that the patient remain overnight with plans to transition off Brilinta with Plavix load at 300 mg at 5 PM on 04/02/2020 and start 75 mg daily today. -Denies recurrent symptoms today  -Continue ASA, Lopressor 25, pravastatin,  Plavix  DM2: -SSI for glucose control inpatient status -Plan to resume PTA Metformin tomorrow, 04/04/2020  Mild OSA: -Noncompliant with CPAP  Remote AF in 2017 (not on anticoagulation): -Not on anticoagulation with no recurrence -Maintaining NSR  Hypertension: -Stable, 138/90>142/99>154/89 -Losartan 25 daily added to regimen -Will up-titrate Lopressor to 25 given persistently elevated BP's and HR in the 90-100's   Consultants: None   The patient was seen and examined by Phyllis Delgado who feels that she is stable and ready for discharge today, 04/03/20. Cath site is stable. Ambulated with cardiac rehabilitation without complication.   Did the patient have an acute coronary syndrome (MI, NSTEMI, STEMI, etc) this admission?:  Yes                               AHA/ACC Clinical Performance & Quality Measures: 1. Aspirin prescribed? - Yes 2. ADP Receptor Inhibitor (Plavix/Clopidogrel, Brilinta/Ticagrelor or Effient/Prasugrel) prescribed (includes medically managed patients)? - Yes 3. Beta Blocker prescribed? - Yes 4. High Intensity Statin (Lipitor 40-80mg  or Crestor 20-40mg ) prescribed? - No - intolerant  5. EF assessed during THIS hospitalization? - Yes 6. For EF <40%, was ACEI/ARB prescribed? - Not Applicable (EF >/= 76%) 7. For EF <40%, Aldosterone Antagonist (Spironolactone or Eplerenone) prescribed? - Not Applicable (EF >/= 73%) 8. Cardiac Rehab Phase II ordered (including medically managed patients)? - Yes   _____________  Discharge Vitals Blood pressure (!) 156/94, pulse (!) 103, temperature 98.2 F (36.8 C), temperature source Oral, resp. rate 16, weight 99.3 kg, SpO2 98 %.  Filed Weights   04/02/20 1225 04/03/20 0400  Weight: 98.9 kg 99.3 kg    Labs & Radiologic Studies    CBC Recent Labs    04/03/20 0441  WBC 8.5  HGB 13.3  HCT 41.2  MCV 90.0  PLT 419   Basic Metabolic Panel Recent Labs    04/03/20 0441  NA 136  K 3.6  CL 105  CO2 21*  GLUCOSE 166*   BUN 8  CREATININE 0.76  CALCIUM 9.0   Liver Function Tests No results for input(s): AST, ALT, ALKPHOS, BILITOT, PROT, ALBUMIN in the last 72 hours. No results for input(s): LIPASE, AMYLASE in the last 72 hours. High Sensitivity Troponin:   No results for input(s): TROPONINIHS in the last 720 hours.  BNP Invalid input(s): POCBNP D-Dimer No results for input(s): DDIMER in the last 72 hours. Hemoglobin A1C No results for input(s): HGBA1C in the last 72 hours. Fasting Lipid Panel No results for input(s): CHOL, HDL, LDLCALC, TRIG, CHOLHDL, LDLDIRECT in the last 72 hours. Thyroid Function Tests No results for input(s): TSH, T4TOTAL, T3FREE, THYROIDAB in the last 72 hours.  Invalid input(s): FREET3 _____________  CARDIAC CATHETERIZATION  Result Date: 04/02/2020  1st Mrg lesion is 95% stenosed.  Post intervention, there is a 0% residual stenosis.  A drug-eluting stent was successfully placed using a SYNERGY XD 2.75X32.  The left ventricular systolic function is normal.  LV end diastolic pressure is normal.  The left ventricular ejection fraction is 55-65% by visual estimate.  1. Single vessel obstructive CAD involving a large first OM 2. Normal LV function 3. Normal LVEDP 4. Successful  PCI of the first OM with DES x 1. Plan: DAPT for one year. Patient has a history of intolerance to statins. Will try pravastatin 10 mg daily. Anticipate same day DC.   Disposition   Pt is being discharged home today in good condition.  Follow-up Plans & Appointments    Follow-up Information    Richardo Priest, MD Follow up on 05/01/2020.   Specialty: Cardiology Why: at 8:20am Contact information: [place] [address] [phone]              Discharge Instructions    Amb Referral to Cardiac Rehabilitation   Complete by: As directed    Diagnosis:  Coronary Stents PTCA     After initial evaluation and assessments completed: Virtual Based Care may be provided alone or in  conjunction with Phase 2 Cardiac Rehab based on patient barriers.: Yes   Call MD for:  difficulty breathing, headache or visual disturbances   Complete by: As directed    Call MD for:  extreme fatigue   Complete by: As directed    Call MD for:  hives   Complete by: As directed    Call MD for:  persistant dizziness or light-headedness   Complete by: As directed    Call MD for:  persistant nausea and vomiting   Complete by: As directed    Call MD for:  redness, tenderness, or signs of infection (pain, swelling, redness, odor or green/yellow discharge around incision site)   Complete by: As directed    Call MD for:  severe uncontrolled pain   Complete by: As directed    Call MD for:  temperature >100.4   Complete by: As directed    Diet - low sodium heart healthy   Complete by: As directed    Discharge instructions   Complete by: As directed    PLEASE DO NOT [place]!!!! Also keep a log of you blood pressures and bring back to your follow up appt. Please call the office with any questions.   Patients taking blood thinners should generally stay away from medicines like ibuprofen, Advil, Motrin, naproxen, and Aleve due to risk of stomach bleeding. You may take Tylenol as directed or talk to your primary doctor about alternatives.  Some studies suggest Prilosec/Omeprazole interacts with Plavix. We changed your Prilosec/Omeprazole to the equivalent dose of Protonix for less chance of interaction.   No driving for 3 days. No lifting over 5 lbs for 1 week. No sexual activity for 1 week. You may return to work on 04/13/20. Keep procedure site clean & dry. If you notice increased pain, swelling, bleeding or pus, call/return!  You may shower, but no soaking baths/hot tubs/pools for 1 week.   Increase activity slowly   Complete by: As directed       Discharge Medications   Allergies as of 04/03/2020      Reactions   Cymbalta [duloxetine Hcl] Anaphylaxis   Duloxetine  Anaphylaxis   Elavil [amitriptyline Hcl] Anaphylaxis   Gabapentin Swelling   MOUTH SWELLING   Humira [adalimumab] Anaphylaxis   Fluid on the heart   Lyrica [pregabalin] Anaphylaxis   Ezetimibe-simvastatin Other (See Comments)   Muscle ache   Fenofibrate Other (See Comments)   Muscle ache   Miconazole Swelling   Vaginal swelling and redness   Statins Other (See Comments)   Muscle ache      Medication List    STOP taking these medications   omeprazole  40 MG capsule Commonly known as: PRILOSEC Replaced by: pantoprazole 40 MG tablet     TAKE these medications   albuterol 108 (90 Base) MCG/ACT inhaler Commonly known as: VENTOLIN HFA Inhale 2 puffs into the lungs every 6 (six) hours as needed for wheezing or shortness of breath.   ALPRAZolam 0.25 MG tablet Commonly known as: XANAX Take 0.25 mg by mouth at bedtime as needed for sleep.   aspirin EC 81 MG tablet Take 1 tablet (81 mg total) by mouth daily. Swallow whole.   aspirin 81 MG chewable tablet Chew 1 tablet (81 mg total) by mouth daily. Start taking on: April 04, 2020   clopidogrel 75 MG tablet Commonly known as: PLAVIX Take 1 tablet (75 mg total) by mouth daily. Start taking on: April 04, 2020   cyanocobalamin 1000 MCG/ML injection Commonly known as: (VITAMIN B-12) Inject 1,000 mcg into the muscle every 30 (thirty) days.   vitamin B-12 1000 MCG tablet Commonly known as: CYANOCOBALAMIN Take 1,000 mcg by mouth daily.   diphenhydramine-acetaminophen 25-500 MG Tabs tablet Commonly known as: TYLENOL PM Take 1 tablet by mouth at bedtime as needed (sleep).   ergocalciferol 1.25 MG (50000 UT) capsule Commonly known as: VITAMIN D2 Take 50,000 Units by mouth once a week.   FLUoxetine 20 MG tablet Commonly known as: PROZAC Take 20 mg by mouth every Monday, Wednesday, and Friday.   folic acid 1 MG tablet Commonly known as: FOLVITE Take 1 mg by mouth daily.   HYDROcodone-acetaminophen 5-325 MG  tablet Commonly known as: NORCO/VICODIN Take 1 tablet by mouth every 6 (six) hours as needed for moderate pain.   hydrocortisone cream 1 % Apply 1 application topically daily as needed (psoriasis).   losartan 25 MG tablet Commonly known as: COZAAR Take 1 tablet (25 mg total) by mouth daily. Start taking on: April 04, 2020   medroxyPROGESTERone 10 MG tablet Commonly known as: PROVERA TAKE 1 TABLET DAILY.   meloxicam 15 MG tablet Commonly known as: MOBIC Take 15 mg by mouth daily.   metFORMIN 1000 MG tablet Commonly known as: GLUCOPHAGE Take 1,000 mg by mouth 2 (two) times daily with a meal.   metoprolol tartrate 25 MG tablet Commonly known as: LOPRESSOR Take 12.5 mg by mouth 2 (two) times daily.   metroNIDAZOLE 0.75 % vaginal gel Commonly known as: METROGEL Place 1 application vaginally daily as needed (during menstrual cycle).   nitroGLYCERIN 0.4 MG SL tablet Commonly known as: Nitrostat Place 1 tablet (0.4 mg total) under the tongue every 5 (five) minutes as needed for chest pain (up to 3 doses max).   Nurtec 75 MG Tbdp Generic drug: Rimegepant Sulfate Take 75 mg by mouth at bedtime as needed for migraine.   Otezla 30 MG Tabs Generic drug: Apremilast Take 30 mg by mouth 2 (two) times daily.   pantoprazole 40 MG tablet Commonly known as: PROTONIX Take 1 tablet (40 mg total) by mouth daily. Start taking on: April 04, 2020 Replaces: omeprazole 40 MG capsule   pravastatin 10 MG tablet Commonly known as: PRAVACHOL Take 1 tablet (10 mg total) by mouth daily.   pravastatin 10 MG tablet Commonly known as: PRAVACHOL Take 1 tablet (10 mg total) by mouth daily at 6 PM.   predniSONE 5 MG tablet Commonly known as: DELTASONE Take 5 mg by mouth daily as needed (arthritis).   SYSTANE OP Place 1 drop into both eyes at bedtime.   tiZANidine 4 MG tablet Commonly known as: ZANAFLEX Take 1 tablet (  4 mg total) by mouth every 6 (six) hours as needed for muscle spasms.    zolmitriptan 5 MG tablet Commonly known as: ZOMIG Take 5 mg by mouth as needed for migraine.       Outstanding Labs/Studies   None   Duration of Discharge Encounter   Greater than 30 minutes including physician time.  Signed, Kathyrn Drown, NP 04/03/2020, 11:55 AM

## 2020-04-17 ENCOUNTER — Telehealth (HOSPITAL_COMMUNITY): Payer: Self-pay

## 2020-04-17 NOTE — Telephone Encounter (Signed)
Faxed referral for Phase II Cardiac Rehab to . °

## 2020-04-30 DIAGNOSIS — K219 Gastro-esophageal reflux disease without esophagitis: Secondary | ICD-10-CM | POA: Insufficient documentation

## 2020-04-30 DIAGNOSIS — E7800 Pure hypercholesterolemia, unspecified: Secondary | ICD-10-CM | POA: Insufficient documentation

## 2020-04-30 DIAGNOSIS — Z8489 Family history of other specified conditions: Secondary | ICD-10-CM | POA: Insufficient documentation

## 2020-04-30 DIAGNOSIS — N979 Female infertility, unspecified: Secondary | ICD-10-CM | POA: Insufficient documentation

## 2020-04-30 DIAGNOSIS — R197 Diarrhea, unspecified: Secondary | ICD-10-CM | POA: Insufficient documentation

## 2020-04-30 DIAGNOSIS — D649 Anemia, unspecified: Secondary | ICD-10-CM | POA: Insufficient documentation

## 2020-04-30 DIAGNOSIS — T8859XA Other complications of anesthesia, initial encounter: Secondary | ICD-10-CM | POA: Insufficient documentation

## 2020-04-30 DIAGNOSIS — F419 Anxiety disorder, unspecified: Secondary | ICD-10-CM | POA: Insufficient documentation

## 2020-04-30 DIAGNOSIS — F32A Depression, unspecified: Secondary | ICD-10-CM | POA: Insufficient documentation

## 2020-04-30 DIAGNOSIS — J45909 Unspecified asthma, uncomplicated: Secondary | ICD-10-CM | POA: Insufficient documentation

## 2020-04-30 DIAGNOSIS — M205X9 Other deformities of toe(s) (acquired), unspecified foot: Secondary | ICD-10-CM | POA: Insufficient documentation

## 2020-04-30 DIAGNOSIS — N912 Amenorrhea, unspecified: Secondary | ICD-10-CM | POA: Insufficient documentation

## 2020-04-30 DIAGNOSIS — M1990 Unspecified osteoarthritis, unspecified site: Secondary | ICD-10-CM | POA: Insufficient documentation

## 2020-04-30 DIAGNOSIS — E282 Polycystic ovarian syndrome: Secondary | ICD-10-CM | POA: Insufficient documentation

## 2020-04-30 DIAGNOSIS — M2020 Hallux rigidus, unspecified foot: Secondary | ICD-10-CM | POA: Insufficient documentation

## 2020-04-30 DIAGNOSIS — Z9889 Other specified postprocedural states: Secondary | ICD-10-CM | POA: Insufficient documentation

## 2020-04-30 DIAGNOSIS — I10 Essential (primary) hypertension: Secondary | ICD-10-CM | POA: Insufficient documentation

## 2020-04-30 DIAGNOSIS — F4310 Post-traumatic stress disorder, unspecified: Secondary | ICD-10-CM | POA: Insufficient documentation

## 2020-04-30 NOTE — Progress Notes (Signed)
Cardiology Office Note:    Date:  05/01/2020   ID:  Phyllis Delgado, DOB 07-02-1971, MRN [id number]  PCP:  Nicoletta Dress, MD  Cardiologist:  Shirlee More, MD    Referring MD: Nicoletta Dress, MD    ASSESSMENT:    1. Coronary artery disease of native artery of native heart with stable angina pectoris ([place])   2. Essential hypertension   3. Hyperlipidemia, mixed   4. Type 2 diabetes mellitus with other circulatory complication, without long-term current use of insulin (HCC)    PLAN:    In order of problems listed above:  1. She is improved on appropriate therapy dual antiplatelet along with maximally tolerated statin initiate highly purified fish oil and may require PCSK9.  [place] heart association class I 2. Stable BP at target continue ARB beta-blocker 3. Continue maximally tolerated statin at highly purified EPA 4. Stable managed by her PCP A1c at target continue Metformin appropriate with CAD   Next appointment: 3 months   Medication Adjustments/Labs and Tests Ordered: Current medicines are reviewed at length with the patient today.  Concerns regarding medicines are outlined above.  No orders of the defined types were placed in this encounter.  Meds ordered this encounter  Medications  . icosapent Ethyl (VASCEPA) 1 g capsule    Sig: Take 2 capsules (2 g total) by mouth 2 (two) times daily.    Dispense:  120 capsule    Refill:  3     chief complaint, follow-up after PCI  History of Present Illness:    Phyllis Delgado is a 49 y.o. female with a hx of CAD recent ACS and PCI with stent M1 04/02/2020 last seen by me as an inpatient at [facility] 04/02/2020. Compliance with diet, lifestyle and medications: Yes  We reviewed her cath findings recent labs.  She was intolerant to Brilinta with respiratory symptoms tolerates clopidogrel and has not bruising or bleeding.  She is having trouble with a statin with joint and muscle pain but tolerates pravastatin 4  times a week.  With her CAD residual elevation in triglycerides will place her on highly purified fish oil,vascepa along with her statin initiate coenzyme Q 10 over-the-counter in 6 weeks check lipid profile.  If LDL remains greater than 70 I would advocate PCSK9 therapy.  Back to work no angina dyspnea palpitation or syncope and feels much better and her previous chronic fatigue has resolved.  Procedures  CORONARY STENT INTERVENTION  LEFT HEART CATH AND CORONARY ANGIOGRAPHY  Conclusion    1st Mrg lesion is 95% stenosed.  Post intervention, there is a 0% residual stenosis.  A drug-eluting stent was successfully placed using a SYNERGY XD 2.75X32.  The left ventricular systolic function is normal.  LV end diastolic pressure is normal.  The left ventricular ejection fraction is 55-65% by visual estimate.   1. Single vessel obstructive CAD involving a large first OM 2. Normal LV function 3. Normal LVEDP 4. Successful PCI of the first OM with DES x 1.   Plan: DAPT for one year. Patient has a history of intolerance to statins. Will try pravastatin 10 mg daily. Anticipate same day DC.    Last hemoglobin A1c 03/30/2020 was 6.9 lipid profile 12/13/2019 cholesterol 211 LDL 129 triglycerides 348 HDL 39.  Non-HDL cholesterol was severely elevated at 172. Past Medical History:  Diagnosis Date  . Adjustment reaction with anxiety and depression 09/13/2019  . Amenorrhea   . Anemia   . Anxiety   .  Arthritis   . Asthma    with sesonal allergies  . B12 deficiency 09/13/2019  . Bilateral lower extremity edema 09/13/2019  . Bone spur of posterior portion of right calcaneus 04/25/2019  . Cervical spondylosis with myelopathy and radiculopathy 04/20/2018  . Complication of anesthesia   . Depression   . Diabetes mellitus   . Diarrhea    "from Metformin"  . Elevated cholesterol   . Family history of adverse reaction to anesthesia    Mother , N/V  . GERD (gastroesophageal reflux disease)   .  Hallux limitus    left great toe  . Hallux rigidus    right great toe  . Hallux rigidus, right foot 03/01/2018  . Heel cord tightness, right 04/18/2019  . History of hand surgery    FINGER--TENDON REPAIR  . Hx of discectomy JUNE 2005   L4,L5, S1   . Hyperlipidemia, mixed 04/02/2020  . Hypertension   . Infertility, female    PRIMARY  . Obesity, Class III, BMI 40-49.9 (morbid obesity) ([place]) 09/13/2019  . Palpitations 09/11/2015   Last Assessment & Plan:  Formatting of this note might be different from the original. Currently controlled per pt report. Reflective of treatment with metoprolol  Continue current regimen  Discussed home monitoring of HR with frequency of few times per month Formatting of this note might be different from the original. Last Assessment & Plan:  Currently controlled per pt report. Reflective of t  . PCOS (polycystic ovarian syndrome)   . PONV (postoperative nausea and vomiting)   . Psoriatic arthritis ([place]) 04/29/2016   Last Assessment & Plan:  Formatting of this note might be different from the original. Arthritis pain is currently controlled per Patient reported pain score of 5/10 with presence of symptoms including stiffness primarily localized to hands, wrists, ankles, and feet.Exacerbating factors include cold weather. Reflective of treatment with [place] Nail and meloxicam  Previous treatments include Humira and  . PTSD (post-traumatic stress disorder)   . Retrocalcaneal bursitis 05/20/2019  . Sinus tachycardia 09/11/2015  . Tendonitis, Achilles, right 04/18/2019  . Type 2 diabetes mellitus with complication, with long-term current use of insulin ([place]) 04/02/2020  . Unstable angina ([place]) 04/02/2020  . Vitamin D deficiency 09/13/2019    Past Surgical History:  Procedure Laterality Date  . ANTERIOR CERVICAL DECOMP/DISCECTOMY FUSION N/A 04/20/2018   Procedure: Cervical 5-6 Anterior cervical decompression/discectomy/fusion;  Surgeon: Ashok Pall, MD;  Location: [place];   Service: Neurosurgery;  Laterality: N/A;  ervical 5-6 Anterior cervical decompression/discectomy/fusion  . BACK SURGERY     Lumbar disectomy  . CARPAL TUNNEL RELEASE Right   . CHOLECYSTECTOMY  10/06  . CORONARY STENT INTERVENTION N/A 04/02/2020   Procedure: CORONARY STENT INTERVENTION;  Surgeon: Martinique, Peter M, MD;  Location: [place] [facility];  Service: Cardiovascular;  Laterality: N/A;  MID OM1  . FINGER TENDON REPAIR Left 1994   index  . KNEE ARTHROSCOPY Right [id number], 2010  . LEFT HEART CATH AND CORONARY ANGIOGRAPHY N/A 04/02/2020   Procedure: LEFT HEART CATH AND CORONARY ANGIOGRAPHY;  Surgeon: Martinique, Peter M, MD;  Location: [place] [facility];  Service: Cardiovascular;  Laterality: N/A;  . TONSILLECTOMY AND ADENOIDECTOMY  11/05    Current Medications: Current Meds  Medication Sig  . albuterol (PROVENTIL HFA;VENTOLIN HFA) 108 (90 Base) MCG/ACT inhaler Inhale 2 puffs into the lungs every 6 (six) hours as needed for wheezing or shortness of breath.  . ALPRAZolam (XANAX) 0.25 MG tablet Take 0.25 mg by mouth at bedtime  as needed for sleep.   Marland Kitchen aspirin 81 MG chewable tablet Chew 1 tablet (81 mg total) by mouth daily.  . clopidogrel (PLAVIX) 75 MG tablet Take 1 tablet (75 mg total) by mouth daily.  . diphenhydramine-acetaminophen (TYLENOL PM) 25-500 MG TABS tablet Take 1 tablet by mouth at bedtime as needed (sleep).  . ergocalciferol (VITAMIN D2) 50000 UNITS capsule Take 50,000 Units by mouth once a week.   Marland Kitchen FLUoxetine (PROZAC) 20 MG tablet Take 20 mg by mouth every Monday, Wednesday, and Friday.  Marland Kitchen Lifitegrast (XIIDRA) 5 % SOLN Place 1 drop into both eyes in the morning and at bedtime.  Marland Kitchen losartan (COZAAR) 25 MG tablet Take 1 tablet (25 mg total) by mouth daily.  . meloxicam (MOBIC) 15 MG tablet Take 15 mg by mouth daily.  . metFORMIN (GLUCOPHAGE) 1000 MG tablet Take 1,000 mg by mouth 2 (two) times daily with a meal.   . metoprolol tartrate (LOPRESSOR) 25 MG tablet Take 12.5 mg by  mouth 2 (two) times daily.  . metroNIDAZOLE (METROGEL) 0.75 % vaginal gel Place 1 application vaginally daily as needed (during menstrual cycle).   . nitroGLYCERIN (NITROSTAT) 0.4 MG SL tablet Place 1 tablet (0.4 mg total) under the tongue every 5 (five) minutes as needed for chest pain (up to 3 doses max).  . NURTEC 75 MG TBDP Take 75 mg by mouth at bedtime as needed for migraine.  . pantoprazole (PROTONIX) 40 MG tablet Take 1 tablet (40 mg total) by mouth daily.  . pravastatin (PRAVACHOL) 10 MG tablet Take 1 tablet (10 mg total) by mouth daily.  . vitamin B-12 (CYANOCOBALAMIN) 1000 MCG tablet Take 1,000 mcg by mouth daily.   Marland Kitchen zolmitriptan (ZOMIG) 5 MG tablet Take 5 mg by mouth as needed for migraine.     Allergies:   Cymbalta [duloxetine hcl], Duloxetine, Elavil [amitriptyline hcl], Gabapentin, Humira [adalimumab], Lyrica [pregabalin], Ezetimibe-simvastatin, Fenofibrate, Miconazole, and Statins   Social History   Socioeconomic History  . Marital status: Married    Spouse name: Not on file  . Number of children: Not on file  . Years of education: Not on file  . Highest education level: Not on file  Occupational History  . Not on file  Tobacco Use  . Smoking status: Never Smoker  . Smokeless tobacco: Never Used  Vaping Use  . Vaping Use: Never used  Substance and Sexual Activity  . Alcohol use: Not Currently  . Drug use: No  . Sexual activity: Yes    Birth control/protection: None  Other Topics Concern  . Not on file  Social History Narrative  . Not on file   Social Determinants of Health   Financial Resource Strain:   . Difficulty of Paying Living Expenses: Not on file  Food Insecurity:   . Worried About Charity fundraiser in the Last Year: Not on file  . Ran Out of Food in the Last Year: Not on file  Transportation Needs:   . Lack of Transportation (Medical): Not on file  . Lack of Transportation (Non-Medical): Not on file  Physical Activity:   . Days of Exercise  per Week: Not on file  . Minutes of Exercise per Session: Not on file  Stress:   . Feeling of Stress : Not on file  Social Connections:   . Frequency of Communication with Friends and Family: Not on file  . Frequency of Social Gatherings with Friends and Family: Not on file  . Attends Religious Services: Not  on file  . Active Member of Clubs or Organizations: Not on file  . Attends Archivist Meetings: Not on file  . Marital Status: Not on file     Family History: The patient's family history includes Cancer in her brother and mother; Diabetes in her brother, father, mother, and sister; Heart disease in her father, mother, and sister; Hypertension in her father, mother, and sister. ROS:   Please see the history of present illness.    All other systems reviewed and are negative.  EKGs/Labs/Other Studies Reviewed:    The following studies were reviewed today:   Recent Labs: 04/03/2020: BUN 8; Creatinine, Ser 0.76; Hemoglobin 13.3; Platelets 361; Potassium 3.6; Sodium 136  Recent Lipid Panel No results found for: CHOL, TRIG, HDL, CHOLHDL, VLDL, LDLCALC, LDLDIRECT  Physical Exam:    VS:  BP 122/78   Pulse 84   Ht 5' 1.5" (1.562 m)   Wt 224 lb 3.2 oz (101.7 kg)   SpO2 97%   BMI 41.68 kg/m     Wt Readings from Last 3 Encounters:  05/01/20 224 lb 3.2 oz (101.7 kg)  04/03/20 218 lb 14.4 oz (99.3 kg)  04/20/18 209 lb (94.8 kg)     GEN:  Well nourished, well developed in no acute distress HEENT: Normal NECK: No JVD; No carotid bruits LYMPHATICS: No lymphadenopathy CARDIAC: RRR, no murmurs, rubs, gallops RESPIRATORY:  Clear to auscultation without rales, wheezing or rhonchi  ABDOMEN: Soft, non-tender, non-distended MUSCULOSKELETAL:  No edema; No deformity  SKIN: Warm and dry NEUROLOGIC:  Alert and oriented x 3 PSYCHIATRIC:  Normal affect    Signed, Shirlee More, MD  05/01/2020 8:54 AM    [place]

## 2020-05-01 ENCOUNTER — Ambulatory Visit (INDEPENDENT_AMBULATORY_CARE_PROVIDER_SITE_OTHER): Payer: PRIVATE HEALTH INSURANCE | Admitting: Cardiology

## 2020-05-01 ENCOUNTER — Encounter: Payer: Self-pay | Admitting: Cardiology

## 2020-05-01 ENCOUNTER — Other Ambulatory Visit: Payer: Self-pay

## 2020-05-01 VITALS — BP 122/78 | HR 84 | Ht 61.5 in | Wt 224.2 lb

## 2020-05-01 DIAGNOSIS — E1159 Type 2 diabetes mellitus with other circulatory complications: Secondary | ICD-10-CM | POA: Diagnosis not present

## 2020-05-01 DIAGNOSIS — I25118 Atherosclerotic heart disease of native coronary artery with other forms of angina pectoris: Secondary | ICD-10-CM | POA: Diagnosis not present

## 2020-05-01 DIAGNOSIS — I10 Essential (primary) hypertension: Secondary | ICD-10-CM | POA: Diagnosis not present

## 2020-05-01 DIAGNOSIS — E782 Mixed hyperlipidemia: Secondary | ICD-10-CM

## 2020-05-01 MED ORDER — ICOSAPENT ETHYL 1 G PO CAPS: 2.0000 g | ORAL_CAPSULE | Freq: Two times a day (BID) | ORAL | 3 refills | Status: DC

## 2020-05-01 NOTE — Patient Instructions (Signed)
Medication Instructions:  Your physician has recommended you make the following change in your medication:  START: Vascepa 2 gm take two tablets by mouth twice daily.   Please get an over the counter CoQ10 and begin taking it daily.  *If you need a refill on your cardiac medications before your next appointment, please call your pharmacy*   Lab Work: Your physician recommends that you return for lab work in: [place] PCP OFFICE Lipids, CMP If you have labs (blood work) drawn today and your tests are completely normal, you will receive your results only by: Marland Kitchen MyChart Message (if you have MyChart) OR . A paper copy in the mail If you have any lab test that is abnormal or we need to change your treatment, we will call you to review the results.   Testing/Procedures: None   Follow-Up: At [facility], you and your health needs are our priority.  As part of our continuing mission to provide you with exceptional heart care, we have created designated Provider Care Teams.  These Care Teams include your primary Cardiologist (physician) and Advanced Practice Providers (APPs -  Physician Assistants and Nurse Practitioners) who all work together to provide you with the care you need, when you need it.  We recommend signing up for the patient portal called "MyChart".  Sign up information is provided on this After Visit Summary.  MyChart is used to connect with patients for Virtual Visits (Telemedicine).  Patients are able to view lab/test results, encounter notes, upcoming appointments, etc.  Non-urgent messages can be sent to your provider as well.   To learn more about what you can do with MyChart, go to [email or web address].    Your next appointment:   3 month(s)  The format for your next appointment:   In Person  Provider:   Shirlee More, MD   Other Instructions

## 2020-07-17 ENCOUNTER — Ambulatory Visit: Payer: PRIVATE HEALTH INSURANCE | Admitting: Cardiology

## 2020-07-30 ENCOUNTER — Ambulatory Visit: Payer: PRIVATE HEALTH INSURANCE | Admitting: Cardiology

## 2020-09-02 DIAGNOSIS — Z8616 Personal history of COVID-19: Secondary | ICD-10-CM

## 2020-09-02 HISTORY — DX: Personal history of COVID-19: Z86.16

## 2020-09-03 NOTE — Progress Notes (Deleted)
Cardiology Office Note:    Date:  09/03/2020   ID:  Phyllis Delgado, DOB 08/23/1970, MRN [id number]  PCP:  Nicoletta Dress, MD  Cardiologist:  Shirlee More, MD    Referring MD: Nicoletta Dress, MD    ASSESSMENT:    No diagnosis found. PLAN:    In order of problems listed above:  1. ***   Next appointment: ***   Medication Adjustments/Labs and Tests Ordered: Current medicines are reviewed at length with the patient today.  Concerns regarding medicines are outlined above.  No orders of the defined types were placed in this encounter.  No orders of the defined types were placed in this encounter.   No chief complaint on file.   History of Present Illness:    Phyllis Delgado is a 50 y.o. female with a hx of CAD recent ACS and PCI with stent M1 04/02/2020   last seen 05/01/2020. Compliance with diet, lifestyle and medications: ***  Procedures   CORONARY STENT INTERVENTION  LEFT HEART CATH AND CORONARY ANGIOGRAPHY  Conclusion      1st Mrg lesion is 95% stenosed.  Post intervention, there is a 0% residual stenosis.  A drug-eluting stent was successfully placed using a SYNERGY XD 2.75X32.  The left ventricular systolic function is normal.  LV end diastolic pressure is normal.  The left ventricular ejection fraction is 55-65% by visual estimate.   1. Single vessel obstructive CAD involving a large first OM 2. Normal LV function 3. Normal LVEDP 4. Successful PCI of the first OM with DES x 1.    Plan: DAPT for one year. Patient has a history of intolerance to statins. Will try pravastatin 10 mg daily. Anticipate same day DC.     Past Medical History:  Diagnosis Date  . Adjustment reaction with anxiety and depression 09/13/2019  . Amenorrhea   . Anemia   . Anxiety   . Arthritis   . Asthma    with sesonal allergies  . B12 deficiency 09/13/2019  . Bilateral lower extremity edema 09/13/2019  . Bone spur of posterior portion of right calcaneus 04/25/2019   . Cervical spondylosis with myelopathy and radiculopathy 04/20/2018  . Complication of anesthesia   . Depression   . Diabetes mellitus   . Diarrhea    "from Metformin"  . Elevated cholesterol   . Family history of adverse reaction to anesthesia    Mother , N/V  . GERD (gastroesophageal reflux disease)   . Hallux limitus    left great toe  . Hallux rigidus    right great toe  . Hallux rigidus, right foot 03/01/2018  . Heel cord tightness, right 04/18/2019  . History of hand surgery    FINGER--TENDON REPAIR  . Hx of discectomy JUNE 2005   L4,L5, S1   . Hyperlipidemia, mixed 04/02/2020  . Hypertension   . Infertility, female    PRIMARY  . Obesity, Class III, BMI 40-49.9 (morbid obesity) ([place]) 09/13/2019  . Palpitations 09/11/2015   Last Assessment & Plan:  Formatting of this note might be different from the original. Currently controlled per pt report. Reflective of treatment with metoprolol  Continue current regimen  Discussed home monitoring of HR with frequency of few times per month Formatting of this note might be different from the original. Last Assessment & Plan:  Currently controlled per pt report. Reflective of t  . PCOS (polycystic ovarian syndrome)   . PONV (postoperative nausea and vomiting)   . Psoriatic arthritis ([place])  04/29/2016   Last Assessment & Plan:  Formatting of this note might be different from the original. Arthritis pain is currently controlled per Patient reported pain score of 5/10 with presence of symptoms including stiffness primarily localized to hands, wrists, ankles, and feet.Exacerbating factors include cold weather. Reflective of treatment with [place] Nail and meloxicam  Previous treatments include Humira and  . PTSD (post-traumatic stress disorder)   . Retrocalcaneal bursitis 05/20/2019  . Sinus tachycardia 09/11/2015  . Tendonitis, Achilles, right 04/18/2019  . Type 2 diabetes mellitus with complication, with long-term current use of insulin ([place]) 04/02/2020  .  Unstable angina ([place]) 04/02/2020  . Vitamin D deficiency 09/13/2019    Past Surgical History:  Procedure Laterality Date  . ANTERIOR CERVICAL DECOMP/DISCECTOMY FUSION N/A 04/20/2018   Procedure: Cervical 5-6 Anterior cervical decompression/discectomy/fusion;  Surgeon: Ashok Pall, MD;  Location: [place];  Service: Neurosurgery;  Laterality: N/A;  ervical 5-6 Anterior cervical decompression/discectomy/fusion  . BACK SURGERY     Lumbar disectomy  . CARPAL TUNNEL RELEASE Right   . CHOLECYSTECTOMY  10/06  . CORONARY STENT INTERVENTION N/A 04/02/2020   Procedure: CORONARY STENT INTERVENTION;  Surgeon: Martinique, Peter M, MD;  Location: [place] [facility];  Service: Cardiovascular;  Laterality: N/A;  MID OM1  . FINGER TENDON REPAIR Left 1994   index  . KNEE ARTHROSCOPY Right [id number], 2010  . LEFT HEART CATH AND CORONARY ANGIOGRAPHY N/A 04/02/2020   Procedure: LEFT HEART CATH AND CORONARY ANGIOGRAPHY;  Surgeon: Martinique, Peter M, MD;  Location: [place] [facility];  Service: Cardiovascular;  Laterality: N/A;  . TONSILLECTOMY AND ADENOIDECTOMY  11/05    Current Medications: No outpatient medications have been marked as taking for the 09/04/20 encounter (Appointment) with Richardo Priest, MD.     Allergies:   Cymbalta [duloxetine hcl], Duloxetine, Elavil [amitriptyline hcl], Gabapentin, Humira [adalimumab], Lyrica [pregabalin], Ezetimibe-simvastatin, Fenofibrate, Miconazole, and Statins   Social History   Socioeconomic History  . Marital status: Married    Spouse name: Not on file  . Number of children: Not on file  . Years of education: Not on file  . Highest education level: Not on file  Occupational History  . Not on file  Tobacco Use  . Smoking status: Never Smoker  . Smokeless tobacco: Never Used  Vaping Use  . Vaping Use: Never used  Substance and Sexual Activity  . Alcohol use: Not Currently  . Drug use: No  . Sexual activity: Yes    Birth control/protection: None  Other Topics  Concern  . Not on file  Social History Narrative  . Not on file   Social Determinants of Health   Financial Resource Strain: Not on file  Food Insecurity: Not on file  Transportation Needs: Not on file  Physical Activity: Not on file  Stress: Not on file  Social Connections: Not on file     Family History: The patient's ***family history includes Cancer in her brother and mother; Diabetes in her brother, father, mother, and sister; Heart disease in her father, mother, and sister; Hypertension in her father, mother, and sister. ROS:   Please see the history of present illness.    All other systems reviewed and are negative.  EKGs/Labs/Other Studies Reviewed:    The following studies were reviewed today:  EKG:  EKG ordered today and personally reviewed.  The ekg ordered today demonstrates ***  Recent Labs: 04/03/2020: BUN 8; Creatinine, Ser 0.76; Hemoglobin 13.3; Platelets 361; Potassium 3.6; Sodium 136  Recent Lipid Panel  No results found for: CHOL, TRIG, HDL, CHOLHDL, VLDL, LDLCALC, LDLDIRECT  Physical Exam:    VS:  There were no vitals taken for this visit.    Wt Readings from Last 3 Encounters:  05/01/20 224 lb 3.2 oz (101.7 kg)  04/03/20 218 lb 14.4 oz (99.3 kg)  04/20/18 209 lb (94.8 kg)     GEN: *** Well nourished, well developed in no acute distress HEENT: Normal NECK: No JVD; No carotid bruits LYMPHATICS: No lymphadenopathy CARDIAC: ***RRR, no murmurs, rubs, gallops RESPIRATORY:  Clear to auscultation without rales, wheezing or rhonchi  ABDOMEN: Soft, non-tender, non-distended MUSCULOSKELETAL:  No edema; No deformity  SKIN: Warm and dry NEUROLOGIC:  Alert and oriented x 3 PSYCHIATRIC:  Normal affect    Signed, Shirlee More, MD  09/03/2020 7:57 AM    [place]

## 2020-09-04 ENCOUNTER — Ambulatory Visit: Payer: PRIVATE HEALTH INSURANCE | Admitting: Cardiology

## 2020-09-04 ENCOUNTER — Other Ambulatory Visit: Payer: Self-pay

## 2020-09-04 ENCOUNTER — Ambulatory Visit (INDEPENDENT_AMBULATORY_CARE_PROVIDER_SITE_OTHER): Payer: PRIVATE HEALTH INSURANCE | Admitting: Cardiology

## 2020-09-04 ENCOUNTER — Encounter: Payer: Self-pay | Admitting: Cardiology

## 2020-09-04 VITALS — BP 132/68 | HR 84 | Ht 61.5 in | Wt 226.4 lb

## 2020-09-04 DIAGNOSIS — E782 Mixed hyperlipidemia: Secondary | ICD-10-CM

## 2020-09-04 DIAGNOSIS — I25118 Atherosclerotic heart disease of native coronary artery with other forms of angina pectoris: Secondary | ICD-10-CM

## 2020-09-04 DIAGNOSIS — E1159 Type 2 diabetes mellitus with other circulatory complications: Secondary | ICD-10-CM

## 2020-09-04 DIAGNOSIS — I10 Essential (primary) hypertension: Secondary | ICD-10-CM

## 2020-09-04 MED ORDER — REPATHA SURECLICK 140 MG/ML ~~LOC~~ SOAJ: 140.0000 mg | SUBCUTANEOUS | 3 refills | Status: DC

## 2020-09-04 MED ORDER — PRAVASTATIN SODIUM 10 MG PO TABS
10.0000 mg | ORAL_TABLET | ORAL | 3 refills | Status: DC
Start: 2020-09-07 — End: 2022-06-14

## 2020-09-04 NOTE — Progress Notes (Signed)
Cardiology Office Note:    Date:  09/04/2020   ID:  Phyllis Delgado, DOB Sep 18, 1970, MRN [id number]  PCP:  Audie Pinto, FNP  Cardiologist:  Norman Herrlich, MD    Referring MD: Paulina Fusi, MD    ASSESSMENT:    1. Coronary artery disease of native artery of native heart with stable angina pectoris (HCC)   2. Essential hypertension   3. Hyperlipidemia, mixed   4. Type 2 diabetes mellitus with other circulatory complication, without long-term current use of insulin (HCC)    PLAN:    In order of problems listed above:  1. Stable CAD she is tolerating dual antiplatelet therapy and will optimize her lipids by adding Repatha. 2. Stable continue treatment including low-dose beta-blocker ARB 3. Poorly controlled working to add PCSK9 inhibitor and she will reduce her pravastatin to 2 days a week long with her icosapent ethyl.  And if we have ongoing symptoms we will discontinue and asked her to communicate through MyChart with me 4. Stable continue current treatment for diabetes   Next appointment: 3 months   Medication Adjustments/Labs and Tests Ordered: Current medicines are reviewed at length with the patient today.  Concerns regarding medicines are outlined above.  No orders of the defined types were placed in this encounter.  No orders of the defined types were placed in this encounter.   Chief Complaint  Patient presents with  . Follow-up  . Coronary Artery Disease    History of Present Illness:    Phyllis Delgado is a 50 y.o. female with type 2 diabetes, BMI greater than 40, hyperlipidemia and hypertension as well as a hx of CAD with PCI and stent first marginal branch 04/02/2020 last seen 05/01/2020.  Prior to that I had seen her [facility] 04/02/2020 where she was intolerant to Brilinta with respiratory symptoms and was transition to clopidogrel.  He is poorly statin intolerant but is able to take pravastatin 4 times a week.  She had severe residual  triglyceride elevation was placed on vascepa along with her statin  Compliance with diet, lifestyle and medications: Yes  Unfortunately her mother died the beginning of 2023/08/18 with liver disease and esophageal variceal bleeding.  This been an additional stressor in her life and recently she had mild COVID-19 has recovered and returned to work.  For the first time she is feeling well but still wheezes intermittently and uses a bronchodilator.  No chest pain shortness of breath palpitation or syncope.  She struggles to take her statin with ongoing muscle symptoms and were going to add a PCSK9 inhibitor and we may need to discontinue her statin.  Her A1c is at upper limits of target 7.0.  Recent labs 09/02/2020 reviewed  Procedures left heart catheterization 04/02/2020  CORONARY STENT INTERVENTION  LEFT HEART CATH AND CORONARY ANGIOGRAPHY  Conclusion    1st Mrg lesion is 95% stenosed.  Post intervention, there is a 0% residual stenosis.  A drug-eluting stent was successfully placed using a SYNERGY XD [id number].  The left ventricular systolic function is normal.  LV end diastolic pressure is normal.  The left ventricular ejection fraction is 55-65% by visual estimate.  1. Single vessel obstructive CAD involving a large first OM 2. Normal LV function 3. Normal LVEDP 4. Successful PCI of the first OM with DES x 1.   Recent labs performed 09/02/2020: Potassium 4.5 creatinine 073 GFR greater than 90 cc normal liver function test hemoglobin 12.3 platelets 150,000. Lipid profile with a  cholesterol 245 LDL 143 triglycerides 399 HDL 44 A1c elevated 7.0% Past Medical History:  Diagnosis Date  . Adjustment reaction with anxiety and depression 09/13/2019  . Amenorrhea   . Anemia   . Anxiety   . Arthritis   . Asthma    with sesonal allergies  . B12 deficiency 09/13/2019  . Bilateral lower extremity edema 09/13/2019  . Bone spur of posterior portion of right calcaneus 04/25/2019  .  Cervical spondylosis with myelopathy and radiculopathy 04/20/2018  . Complication of anesthesia   . Depression   . Diabetes mellitus   . Diarrhea    "from Metformin"  . Elevated cholesterol   . Family history of adverse reaction to anesthesia    Mother , N/V  . GERD (gastroesophageal reflux disease)   . Hallux limitus    left great toe  . Hallux rigidus    right great toe  . Hallux rigidus, right foot 03/01/2018  . Heel cord tightness, right 04/18/2019  . History of hand surgery    FINGER--TENDON REPAIR  . Hx of discectomy JUNE 2005   L4,L5, S1   . Hyperlipidemia, mixed 04/02/2020  . Hypertension   . Infertility, female    PRIMARY  . Obesity, Class III, BMI 40-49.9 (morbid obesity) ([place]) 09/13/2019  . Palpitations 09/11/2015   Last Assessment & Plan:  Formatting of this note might be different from the original. Currently controlled per pt report. Reflective of treatment with metoprolol  Continue current regimen  Discussed home monitoring of HR with frequency of few times per month Formatting of this note might be different from the original. Last Assessment & Plan:  Currently controlled per pt report. Reflective of t  . PCOS (polycystic ovarian syndrome)   . PONV (postoperative nausea and vomiting)   . Psoriatic arthritis ([place]) 04/29/2016   Last Assessment & Plan:  Formatting of this note might be different from the original. Arthritis pain is currently controlled per Patient reported pain score of 5/10 with presence of symptoms including stiffness primarily localized to hands, wrists, ankles, and feet.Exacerbating factors include cold weather. Reflective of treatment with [place] and meloxicam  Previous treatments include Humira and  . PTSD (post-traumatic stress disorder)   . Retrocalcaneal bursitis 05/20/2019  . Sinus tachycardia 09/11/2015  . Tendonitis, Achilles, right 04/18/2019  . Type 2 diabetes mellitus with complication, with long-term current use of insulin ([place]) 04/02/2020  .  Unstable angina ([place]) 04/02/2020  . Vitamin D deficiency 09/13/2019    Past Surgical History:  Procedure Laterality Date  . ANTERIOR CERVICAL DECOMP/DISCECTOMY FUSION N/A 04/20/2018   Procedure: Cervical 5-6 Anterior cervical decompression/discectomy/fusion;  Surgeon: Ashok Pall, MD;  Location: [place];  Service: Neurosurgery;  Laterality: N/A;  ervical 5-6 Anterior cervical decompression/discectomy/fusion  . BACK SURGERY     Lumbar disectomy  . CARPAL TUNNEL RELEASE Right   . CHOLECYSTECTOMY  10/06  . CORONARY STENT INTERVENTION N/A 04/02/2020   Procedure: CORONARY STENT INTERVENTION;  Surgeon: Martinique, Peter M, MD;  Location: [place] [facility];  Service: Cardiovascular;  Laterality: N/A;  MID OM1  . FINGER TENDON REPAIR Left 1994   index  . KNEE ARTHROSCOPY Right [phone], 2010  . LEFT HEART CATH AND CORONARY ANGIOGRAPHY N/A 04/02/2020   Procedure: LEFT HEART CATH AND CORONARY ANGIOGRAPHY;  Surgeon: Martinique, Peter M, MD;  Location: [place] [facility];  Service: Cardiovascular;  Laterality: N/A;  . TONSILLECTOMY AND ADENOIDECTOMY  11/05    Current Medications: Current Meds  Medication Sig  . albuterol (  PROVENTIL HFA;VENTOLIN HFA) 108 (90 Base) MCG/ACT inhaler Inhale 2 puffs into the lungs every 6 (six) hours as needed for wheezing or shortness of breath.  . ALPRAZolam (XANAX) 0.25 MG tablet Take 0.25 mg by mouth at bedtime as needed for sleep.   Marland Kitchen aspirin 81 MG chewable tablet Chew 1 tablet (81 mg total) by mouth daily.  . clopidogrel (PLAVIX) 75 MG tablet Take 1 tablet (75 mg total) by mouth daily.  . diphenhydramine-acetaminophen (TYLENOL PM) 25-500 MG TABS tablet Take 1 tablet by mouth at bedtime as needed (sleep).  . ergocalciferol (VITAMIN D2) 50000 UNITS capsule Take 50,000 Units by mouth once a week.   Marland Kitchen FLUoxetine (PROZAC) 20 MG tablet Take 20 mg by mouth every Monday, Wednesday, and Friday.  . icosapent Ethyl (VASCEPA) 1 g capsule Take 2 capsules (2 g total) by mouth 2 (two)  times daily.  Marland Kitchen Lifitegrast (XIIDRA) 5 % SOLN Place 1 drop into both eyes in the morning and at bedtime.  Marland Kitchen losartan (COZAAR) 25 MG tablet Take 1 tablet (25 mg total) by mouth daily. (Patient taking differently: Take 25 mg by mouth. Take 0.5 tablet (12.5 mg) every night)  . meloxicam (MOBIC) 15 MG tablet Take 15 mg by mouth daily.  . metFORMIN (GLUCOPHAGE) 1000 MG tablet Take 1,000 mg by mouth 2 (two) times daily with a meal.  . metoprolol tartrate (LOPRESSOR) 25 MG tablet Take 12.5 mg by mouth 2 (two) times daily.  . pantoprazole (PROTONIX) 40 MG tablet Take 1 tablet (40 mg total) by mouth daily.  . pravastatin (PRAVACHOL) 10 MG tablet Take 1 tablet (10 mg total) by mouth daily. (Patient taking differently: Take 10 mg by mouth. Take 1 tablet Sunday , Tuesday and Friday)  . vitamin B-12 (CYANOCOBALAMIN) 1000 MCG tablet Take 1,000 mcg by mouth daily.      Allergies:   Cymbalta [duloxetine hcl], Duloxetine, Elavil [amitriptyline hcl], Gabapentin, Humira [adalimumab], Lyrica [pregabalin], Ticagrelor, Ezetimibe-simvastatin, Fenofibrate, Miconazole, and Statins   Social History   Socioeconomic History  . Marital status: Married    Spouse name: Not on file  . Number of children: Not on file  . Years of education: Not on file  . Highest education level: Not on file  Occupational History  . Not on file  Tobacco Use  . Smoking status: Never Smoker  . Smokeless tobacco: Never Used  Vaping Use  . Vaping Use: Never used  Substance and Sexual Activity  . Alcohol use: Not Currently  . Drug use: No  . Sexual activity: Yes    Birth control/protection: None  Other Topics Concern  . Not on file  Social History Narrative  . Not on file   Social Determinants of Health   Financial Resource Strain: Not on file  Food Insecurity: Not on file  Transportation Needs: Not on file  Physical Activity: Not on file  Stress: Not on file  Social Connections: Not on file     Family History: The  patient's family history includes Cancer in her brother and mother; Diabetes in her brother, father, mother, and sister; Heart disease in her father, mother, and sister; Hypertension in her father, mother, and sister. ROS:   Please see the history of present illness.    All other systems reviewed and are negative.  EKGs/Labs/Other Studies Reviewed:    The following studies were reviewed today:  Recent Labs:  Hemoglobin A1c 03/30/2020 was 6.9 lipid profile 12/13/2019 cholesterol 211 LDL 129 triglycerides 348 HDL 39.  Non-HDL cholesterol  was severely elevated at 172. 04/03/2020: BUN 8; Creatinine, Ser 0.76; Hemoglobin 13.3; Platelets 361; Potassium 3.6; Sodium 136  Physical Exam:    VS:  BP 132/68   Pulse 84   Ht 5' 1.5" (1.562 m)   Wt 226 lb 6.4 oz (102.7 kg)   SpO2 99%   BMI 42.09 kg/m     Wt Readings from Last 3 Encounters:  09/04/20 226 lb 6.4 oz (102.7 kg)  05/01/20 224 lb 3.2 oz (101.7 kg)  04/03/20 218 lb 14.4 oz (99.3 kg)     GEN:  Well nourished, well developed in no acute distress HEENT: Normal NECK: No JVD; No carotid bruits LYMPHATICS: No lymphadenopathy CARDIAC: RRR, no murmurs, rubs, gallops RESPIRATORY:  Clear to auscultation without rales, wheezing or rhonchi  ABDOMEN: Soft, non-tender, non-distended MUSCULOSKELETAL:  No edema; No deformity  SKIN: Warm and dry NEUROLOGIC:  Alert and oriented x 3 PSYCHIATRIC:  Normal affect    Signed, Shirlee More, MD  09/04/2020 1:53 PM    [facility]

## 2020-09-04 NOTE — Patient Instructions (Addendum)
Medication Instructions:  Your physician has recommended you make the following change in your medication:  DECREASE: Pravastatin to two times weekly.  START: Repatha 140 mg take one injection every 14 days.   *If you need a refill on your cardiac medications before your next appointment, please call your pharmacy*   Lab Work: Your physician recommends that you return for lab work in: 6 weeks after starting [place]  If you have labs (blood work) drawn today and your tests are completely normal, you will receive your results only by: Marland Kitchen MyChart Message (if you have MyChart) OR . A paper copy in the mail If you have any lab test that is abnormal or we need to change your treatment, we will call you to review the results.   Testing/Procedures: None   Follow-Up: At [facility], you and your health needs are our priority.  As part of our continuing mission to provide you with exceptional heart care, we have created designated Provider Care Teams.  These Care Teams include your primary Cardiologist (physician) and Advanced Practice Providers (APPs -  Physician Assistants and Nurse Practitioners) who all work together to provide you with the care you need, when you need it.  We recommend signing up for the patient portal called "MyChart".  Sign up information is provided on this After Visit Summary.  MyChart is used to connect with patients for Virtual Visits (Telemedicine).  Patients are able to view lab/test results, encounter notes, upcoming appointments, etc.  Non-urgent messages can be sent to your provider as well.   To learn more about what you can do with MyChart, go to [email or web address].    Your next appointment:   3 month(s)  The format for your next appointment:   In Person  Provider:   Shirlee More, MD   Other Instructions

## 2020-09-07 ENCOUNTER — Telehealth: Payer: Self-pay

## 2020-09-07 NOTE — Telephone Encounter (Signed)
PA approved on CMM for Repatha through 03-04-21.

## 2020-12-04 ENCOUNTER — Ambulatory Visit: Payer: PRIVATE HEALTH INSURANCE | Admitting: Cardiology

## 2020-12-22 DIAGNOSIS — R5381 Other malaise: Secondary | ICD-10-CM

## 2020-12-22 DIAGNOSIS — R5383 Other fatigue: Secondary | ICD-10-CM

## 2020-12-22 HISTORY — DX: Other fatigue: R53.83

## 2020-12-22 HISTORY — DX: Other malaise: R53.81

## 2020-12-25 DIAGNOSIS — Z79899 Other long term (current) drug therapy: Secondary | ICD-10-CM

## 2020-12-25 HISTORY — DX: Other long term (current) drug therapy: Z79.899

## 2021-01-15 ENCOUNTER — Encounter: Payer: Self-pay | Admitting: Cardiology

## 2021-01-15 ENCOUNTER — Ambulatory Visit: Payer: PRIVATE HEALTH INSURANCE | Admitting: Cardiology

## 2021-01-15 ENCOUNTER — Other Ambulatory Visit: Payer: Self-pay

## 2021-01-15 VITALS — BP 130/80 | HR 88 | Ht 61.5 in | Wt 227.0 lb

## 2021-01-15 DIAGNOSIS — E1159 Type 2 diabetes mellitus with other circulatory complications: Secondary | ICD-10-CM

## 2021-01-15 DIAGNOSIS — E782 Mixed hyperlipidemia: Secondary | ICD-10-CM

## 2021-01-15 DIAGNOSIS — I25118 Atherosclerotic heart disease of native coronary artery with other forms of angina pectoris: Secondary | ICD-10-CM

## 2021-01-15 DIAGNOSIS — I10 Essential (primary) hypertension: Secondary | ICD-10-CM | POA: Diagnosis not present

## 2021-01-15 NOTE — Progress Notes (Signed)
Cardiology Office Note:    Date:  01/15/2021   ID:  Phyllis Delgado, DOB [birth year], MRN [id number]  PCP:  Georganna Skeans, PA-C  Cardiologist:  Shirlee More, MD    Referring MD: Gweneth Fritter, FNP    ASSESSMENT:    1. Coronary artery disease of native artery of native heart with stable angina pectoris ([place])   2. Essential hypertension   3. Hyperlipidemia, mixed   4. Type 2 diabetes mellitus with other circulatory complication, without long-term current use of insulin (HCC)    PLAN:    In order of problems listed above:  1. She continues to do well with CAD good response to PCI and stent approaching 1 year will drop off clopidogrel and continue aspirin.  New York Heart Association class I at this time I do not think she requires repeat ischemia evaluation 2. Stable at target continue current treatment including beta-blocker and ARB 3. Stable presently off a separate because of GI intolerance with methotrexate continue Repatha and minimum dose of low intensity statin. 4. Stable A1c at target   Next appointment: 6 months   Medication Adjustments/Labs and Tests Ordered: Current medicines are reviewed at length with the patient today.  Concerns regarding medicines are outlined above.  No orders of the defined types were placed in this encounter.  No orders of the defined types were placed in this encounter.   Chief Complaint  Patient presents with  . Follow-up  . Coronary Artery Disease    History of Present Illness:    Phyllis Delgado is a 50 y.o. female with a hx of type 2 diabetes hypertension hyperlipidemia with statin intolerance and CAD with PCI and stenting of the first marginal branch  last seen 04/02/2020.  Compliance with diet, lifestyle and medications: Yes  She continues to do well but has heavy menstrual periods hemoglobin dropped out of clopidogrel and 1 year anniversary of her PCI She has not been taking the Septra because of GI intolerance with  methotrexate. She continues to exercise and has had no angina dyspnea palpitation or syncope. Her lipids are at target and she is able to tolerate low-dose pravastatin 2 days a week along with her PCSK9 inhibitor  Procedures left heart catheterization 04/02/2020  CORONARY STENT INTERVENTION  LEFT HEART CATH AND CORONARY ANGIOGRAPHY  Conclusion    1st Mrg lesion is 95% stenosed.  Post intervention, there is a 0% residual stenosis.  A drug-eluting stent was successfully placed using a SYNERGY XD 2.75X32.  The left ventricular systolic function is normal.  LV end diastolic pressure is normal.  The left ventricular ejection fraction is 55-65% by visual estimate.  1. Single vessel obstructive CAD involving a large first OM 2. Normal LV function 3. Normal LVEDP 4. Successful PCI of the first OM with DES x 1  Recent labs [facility] 12/22/2020: Cholesterol 154 LDL at target 76 triglycerides 339 HDL 47 non-HDL cholesterol 107 Sodium 136 potassium 4.6 creatinine 0.83 GFR 87 cc and normal liver function test TSH normal 2.66 hemoglobin 11.6 Hemoglobin A1c 6.9% Past Medical History:  Diagnosis Date  . Adjustment reaction with anxiety and depression 09/13/2019  . Amenorrhea   . Anemia   . Anxiety   . Arthritis   . Asthma    with sesonal allergies  . B12 deficiency 09/13/2019  . Bilateral lower extremity edema 09/13/2019  . Bone spur of posterior portion of right calcaneus 04/25/2019  . Cervical spondylosis with myelopathy and radiculopathy 04/20/2018  .  Complication of anesthesia   . Depression   . Diabetes mellitus   . Diarrhea    "from Metformin"  . Elevated cholesterol   . Family history of adverse reaction to anesthesia    Mother , N/V  . GERD (gastroesophageal reflux disease)   . Hallux limitus    left great toe  . Hallux rigidus    right great toe  . Hallux rigidus, right foot 03/01/2018  . Heel cord tightness, right 04/18/2019  . History of hand surgery     FINGER--TENDON REPAIR  . Hx of discectomy JUNE 2005   L4,L5, S1   . Hyperlipidemia, mixed 04/02/2020  . Hypertension   . Infertility, female    PRIMARY  . Obesity, Class III, BMI 40-49.9 (morbid obesity) ([place]) 09/13/2019  . Palpitations 09/11/2015   Last Assessment & Plan:  Formatting of this note might be different from the original. Currently controlled per pt report. Reflective of treatment with metoprolol  Continue current regimen  Discussed home monitoring of HR with frequency of few times per month Formatting of this note might be different from the original. Last Assessment & Plan:  Currently controlled per pt report. Reflective of t  . PCOS (polycystic ovarian syndrome)   . PONV (postoperative nausea and vomiting)   . Psoriatic arthritis ([place]) 04/29/2016   Last Assessment & Plan:  Formatting of this note might be different from the original. Arthritis pain is currently controlled per Patient reported pain score of 5/10 with presence of symptoms including stiffness primarily localized to hands, wrists, ankles, and feet.Exacerbating factors include cold weather. Reflective of treatment with [place] Nail and meloxicam  Previous treatments include Humira and  . PTSD (post-traumatic stress disorder)   . Retrocalcaneal bursitis 05/20/2019  . Sinus tachycardia 09/11/2015  . Tendonitis, Achilles, right 04/18/2019  . Type 2 diabetes mellitus with complication, with long-term current use of insulin ([place]) 04/02/2020  . Unstable angina ([place]) 04/02/2020  . Vitamin D deficiency 09/13/2019    Past Surgical History:  Procedure Laterality Date  . ANTERIOR CERVICAL DECOMP/DISCECTOMY FUSION N/A 04/20/2018   Procedure: Cervical 5-6 Anterior cervical decompression/discectomy/fusion;  Surgeon: Ashok Pall, MD;  Location: [place];  Service: Neurosurgery;  Laterality: N/A;  ervical 5-6 Anterior cervical decompression/discectomy/fusion  . BACK SURGERY     Lumbar disectomy  . CARPAL TUNNEL RELEASE Right   .  CHOLECYSTECTOMY  10/06  . CORONARY STENT INTERVENTION N/A 04/02/2020   Procedure: CORONARY STENT INTERVENTION;  Surgeon: Martinique, Peter M, MD;  Location: [place] [facility];  Service: Cardiovascular;  Laterality: N/A;  MID OM1  . FINGER TENDON REPAIR Left 1994   index  . KNEE ARTHROSCOPY Right [id number], 2010  . LEFT HEART CATH AND CORONARY ANGIOGRAPHY N/A 04/02/2020   Procedure: LEFT HEART CATH AND CORONARY ANGIOGRAPHY;  Surgeon: Martinique, Peter M, MD;  Location: [place] [facility];  Service: Cardiovascular;  Laterality: N/A;  . TONSILLECTOMY AND ADENOIDECTOMY  11/05    Current Medications: Current Meds  Medication Sig  . albuterol (PROVENTIL HFA;VENTOLIN HFA) 108 (90 Base) MCG/ACT inhaler Inhale 2 puffs into the lungs every 6 (six) hours as needed for wheezing or shortness of breath.  . ALPRAZolam (XANAX) 0.25 MG tablet Take 0.25 mg by mouth at bedtime as needed for sleep.   Marland Kitchen aspirin 81 MG chewable tablet Chew 1 tablet (81 mg total) by mouth daily.  . Biotin 1 MG CAPS Take 1 mg by mouth daily.  . clopidogrel (PLAVIX) 75 MG tablet Take 1 tablet (75 mg  total) by mouth daily.  . Collagen-Vitamin C-Biotin (COLLAGEN 1500/C) 500-50-0.8 MG CAPS Take 1 tablet by mouth daily.  . cycloSPORINE (RESTASIS) 0.05 % ophthalmic emulsion Place 1 drop into both eyes in the morning and at bedtime.  . diphenhydramine-acetaminophen (TYLENOL PM) 25-500 MG TABS tablet Take 1 tablet by mouth at bedtime as needed (sleep).  . ergocalciferol (VITAMIN D2) 50000 UNITS capsule Take 50,000 Units by mouth once a week.   . Evolocumab (REPATHA SURECLICK) 299 MG/ML SOAJ Inject 140 mg into the skin every 14 (fourteen) days.  . ferrous sulfate 324 MG TBEC Take 324 mg by mouth 3 (three) times a week.  Marland Kitchen FLUoxetine (PROZAC) 20 MG tablet Take 20 mg by mouth every Monday, Wednesday, and Friday.  . folic acid (FOLVITE) 1 MG tablet Take 1 mg by mouth daily.  Marland Kitchen icosapent Ethyl (VASCEPA) 1 g capsule Take 2 capsules (2 g total) by mouth  2 (two) times daily.  Marland Kitchen losartan (COZAAR) 25 MG tablet Take 12.5 mg by mouth daily.  . metFORMIN (GLUCOPHAGE) 1000 MG tablet Take 1,000 mg by mouth 2 (two) times daily with a meal.  . methotrexate 2.5 MG tablet Take 15 mg by mouth once a week.  . metoprolol tartrate (LOPRESSOR) 25 MG tablet Take 12.5 mg by mouth 2 (two) times daily.  . nitroGLYCERIN (NITROSTAT) 0.4 MG SL tablet Place 1 tablet (0.4 mg total) under the tongue every 5 (five) minutes as needed for chest pain (up to 3 doses max).  . NURTEC 75 MG TBDP Take 75 mg by mouth at bedtime as needed for migraine.  . ondansetron (ZOFRAN) 4 MG tablet Take 4 mg by mouth every 8 (eight) hours as needed for nausea.  . pantoprazole (PROTONIX) 40 MG tablet Take 1 tablet (40 mg total) by mouth daily.  . pravastatin (PRAVACHOL) 10 MG tablet Take 1 tablet (10 mg total) by mouth 2 (two) times a week.  . traZODone (DESYREL) 50 MG tablet Take 50 mg by mouth at bedtime as needed for sleep.  . vitamin B-12 (CYANOCOBALAMIN) 1000 MCG tablet Take 1,000 mcg by mouth daily.   Marland Kitchen zolmitriptan (ZOMIG) 5 MG tablet Take 5 mg by mouth as needed for migraine.     Allergies:   Certolizumab pegol, Cymbalta [duloxetine hcl], Duloxetine, Elavil [amitriptyline hcl], Gabapentin, Humira [adalimumab], Lyrica [pregabalin], Ticagrelor, Ezetimibe-simvastatin, Fenofibrate, Miconazole, and Statins   Social History   Socioeconomic History  . Marital status: Married    Spouse name: Not on file  . Number of children: Not on file  . Years of education: Not on file  . Highest education level: Not on file  Occupational History  . Not on file  Tobacco Use  . Smoking status: Never Smoker  . Smokeless tobacco: Never Used  Vaping Use  . Vaping Use: Never used  Substance and Sexual Activity  . Alcohol use: Not Currently  . Drug use: No  . Sexual activity: Yes    Birth control/protection: None  Other Topics Concern  . Not on file  Social History Narrative  . Not on file    Social Determinants of Health   Financial Resource Strain: Not on file  Food Insecurity: Not on file  Transportation Needs: Not on file  Physical Activity: Not on file  Stress: Not on file  Social Connections: Not on file     Family History: The patient's family history includes Cancer in her brother and mother; Diabetes in her brother, father, mother, and sister; Heart disease in her  father, mother, and sister; Hypertension in her father, mother, and sister. ROS:   Please see the history of present illness.    All other systems reviewed and are negative.  EKGs/Labs/Other Studies Reviewed:    The following studies were reviewed today:    Recent Labs: 04/03/2020: BUN 8; Creatinine, Ser 0.76; Hemoglobin 13.3; Platelets 361; Potassium 3.6; Sodium 136  Recent Lipid Panel No results found for: CHOL, TRIG, HDL, CHOLHDL, VLDL, LDLCALC, LDLDIRECT  Physical Exam:    VS:  BP 130/80 (BP Location: Right Arm, Patient Position: Sitting, Cuff Size: Normal)   Pulse 88   Ht 5' 1.5" (1.562 m)   Wt 227 lb (103 kg)   SpO2 97%   BMI 42.20 kg/m     Wt Readings from Last 3 Encounters:  01/15/21 227 lb (103 kg)  09/04/20 226 lb 6.4 oz (102.7 kg)  05/01/20 224 lb 3.2 oz (101.7 kg)     GEN:  Well nourished, well developed in no acute distress HEENT: Normal NECK: No JVD; No carotid bruits LYMPHATICS: No lymphadenopathy CARDIAC: RRR, no murmurs, rubs, gallops RESPIRATORY:  Clear to auscultation without rales, wheezing or rhonchi  ABDOMEN: Soft, non-tender, non-distended MUSCULOSKELETAL:  No edema; No deformity  SKIN: Warm and dry no pallor NEUROLOGIC:  Alert and oriented x 3 PSYCHIATRIC:  Normal affect    Signed, Shirlee More, MD  01/15/2021 2:46 PM    [facility]

## 2021-01-15 NOTE — Patient Instructions (Signed)
Medication Instructions:  Your physician recommends that you continue on your current medications as directed. Please refer to the Current Medication list given to you today.  Please stop your Plavix on 04/02/2021 *If you need a refill on your cardiac medications before your next appointment, please call your pharmacy*   Lab Work: None If you have labs (blood work) drawn today and your tests are completely normal, you will receive your results only by: Marland Kitchen MyChart Message (if you have MyChart) OR . A paper copy in the mail If you have any lab test that is abnormal or we need to change your treatment, we will call you to review the results.   Testing/Procedures: NOne   Follow-Up: At [facility], you and your health needs are our priority.  As part of our continuing mission to provide you with exceptional heart care, we have created designated Provider Care Teams.  These Care Teams include your primary Cardiologist (physician) and Advanced Practice Providers (APPs -  Physician Assistants and Nurse Practitioners) who all work together to provide you with the care you need, when you need it.  We recommend signing up for the patient portal called "MyChart".  Sign up information is provided on this After Visit Summary.  MyChart is used to connect with patients for Virtual Visits (Telemedicine).  Patients are able to view lab/test results, encounter notes, upcoming appointments, etc.  Non-urgent messages can be sent to your provider as well.   To learn more about what you can do with MyChart, go to [email or web address].    Your next appointment:   6 month(s)  The format for your next appointment:   In Person  Provider:   Shirlee More, MD   Other Instructions

## 2021-01-27 DIAGNOSIS — H938X2 Other specified disorders of left ear: Secondary | ICD-10-CM

## 2021-01-27 HISTORY — DX: Other specified disorders of left ear: H93.8X2

## 2021-02-16 ENCOUNTER — Telehealth: Payer: Self-pay

## 2021-02-16 NOTE — Telephone Encounter (Signed)
Prior Auth re initiated for Repatha. Status is pending. Case [id number] spoke with [place]

## 2021-03-12 ENCOUNTER — Telehealth: Payer: Self-pay

## 2021-03-12 NOTE — Telephone Encounter (Signed)
Additional information requested from CoverMyMeds for prior authorization of Repatha sent as requested. Waiting response

## 2021-03-22 ENCOUNTER — Telehealth: Payer: Self-pay

## 2021-03-22 NOTE — Telephone Encounter (Signed)
Repatha approved [id number]

## 2021-04-21 MED ORDER — CLOPIDOGREL BISULFATE 75 MG PO TABS: 75.0000 mg | ORAL_TABLET | Freq: Every day | ORAL | 3 refills | Status: DC

## 2021-08-27 ENCOUNTER — Encounter: Payer: Self-pay | Admitting: Cardiology

## 2021-08-27 ENCOUNTER — Other Ambulatory Visit: Payer: Self-pay

## 2021-08-27 ENCOUNTER — Ambulatory Visit (INDEPENDENT_AMBULATORY_CARE_PROVIDER_SITE_OTHER): Payer: PRIVATE HEALTH INSURANCE | Admitting: Cardiology

## 2021-08-27 VITALS — BP 150/90 | HR 80 | Ht 61.5 in | Wt 225.6 lb

## 2021-08-27 DIAGNOSIS — I10 Essential (primary) hypertension: Secondary | ICD-10-CM

## 2021-08-27 DIAGNOSIS — E782 Mixed hyperlipidemia: Secondary | ICD-10-CM

## 2021-08-27 DIAGNOSIS — I25118 Atherosclerotic heart disease of native coronary artery with other forms of angina pectoris: Secondary | ICD-10-CM

## 2021-08-27 DIAGNOSIS — E1159 Type 2 diabetes mellitus with other circulatory complications: Secondary | ICD-10-CM | POA: Diagnosis not present

## 2021-08-27 DIAGNOSIS — Z789 Other specified health status: Secondary | ICD-10-CM

## 2021-08-27 NOTE — Progress Notes (Signed)
Cardiology Office Note:    Date:  08/27/2021   ID:  Phyllis Delgado, DOB 1970/08/26, MRN [id number]  PCP:  Georganna Skeans, PA-C  Cardiologist:  Shirlee More, MD    Referring MD: Georganna Skeans, PA-C    ASSESSMENT:    1. Coronary artery disease of native artery of native heart with stable angina pectoris ([place])   2. Type 2 diabetes mellitus with other circulatory complication, without long-term current use of insulin ([place])   3. Essential hypertension   4. Hyperlipidemia, mixed   5. Statin intolerance    PLAN:    In order of problems listed above:  She continues to do well after PCI and stent for CAD with ACS and elevated high-sensitivity troponin she will continue her dual antiplatelet therapy till next visit along with her antihypertensives.  Her diabetes is well controlled A1c at target.  New York Heart Association class I. On good lipid-lowering combined therapy PCSK9 inhibitor minimum dose of low intensity statin and icosapent ethyl   Next appointment: 9 months   Medication Adjustments/Labs and Tests Ordered: Current medicines are reviewed at length with the patient today.  Concerns regarding medicines are outlined above.  Orders Placed This Encounter  Procedures   EKG 12-Lead   No orders of the defined types were placed in this encounter.   No chief complaint on file.   History of Present Illness:    ZOIEY CHRISTY is a 51 y.o. female with a hx of type 2 diabetes hypertension hyperlipidemia with statin intolerance and CAD with PCI and stenting marginal branch left circumflex 04/02/2020 last seen . Compliance with diet, lifestyle and medications: Yes  Procedures left heart catheterization 04/02/2020   CORONARY STENT INTERVENTION  LEFT HEART CATH AND CORONARY ANGIOGRAPHY  Conclusion     1st Mrg lesion is 95% stenosed. Post intervention, there is a 0% residual stenosis. A drug-eluting stent was successfully placed using a SYNERGY XD 2.75X32. The left ventricular  systolic function is normal. LV end diastolic pressure is normal. The left ventricular ejection fraction is 55-65% by visual estimate.   1. Single vessel obstructive CAD involving a large first OM 2. Normal LV function 3. Normal LVEDP.  4. Successful PCI of the first OM with DES x [place] PCP 06/03/2021: Cholesterol 154 LDL 79 triglycerides 339 HDL 47 non-HDL cholesterol 117 06/03/2021 A1c 6.0% CMP normal creatinine 0.79 GFR 90 cc/min  She continues to do well very attentive to her diabetes compliant with her medications feels well and has had no anginal discomfort. She bought a new bra before her wedding and chest tightness related to the fact that it was too tight.  She finds that she manages No longer is taking methotrexate asked me my opinion about Mobic and I think is on she is on GI prophylaxis with a PPI that she can take it She continues in her dual antiplatelet therapy with about 1 year ago for intervention Past Medical History:  Diagnosis Date   Adjustment reaction with anxiety and depression 09/13/2019   Amenorrhea    Anemia    Anxiety    Arthritis    Asthma    with sesonal allergies   B12 deficiency 09/13/2019   Bilateral lower extremity edema 09/13/2019   Bone spur of posterior portion of right calcaneus 04/25/2019   Cervical spondylosis with myelopathy and radiculopathy 03/15/4480   Complication of anesthesia    Depression    Diabetes mellitus    Diarrhea    "  from Metformin"   Elevated cholesterol    Family history of adverse reaction to anesthesia    Mother , N/V   GERD (gastroesophageal reflux disease)    Hallux limitus    left great toe   Hallux rigidus    right great toe   Hallux rigidus, right foot 03/01/2018   Heel cord tightness, right 04/18/2019   History of hand surgery    FINGER--TENDON REPAIR   Hx of discectomy JUNE 2005   L4,L5, S1    Hyperlipidemia, mixed 04/02/2020   Hypertension    Infertility, female    PRIMARY   Obesity,  Class III, BMI 40-49.9 (morbid obesity) ([place]) 09/13/2019   Palpitations 09/11/2015   Last Assessment & Plan:  Formatting of this note might be different from the original. Currently controlled per pt report. Reflective of treatment with metoprolol  Continue current regimen  Discussed home monitoring of HR with frequency of few times per month Formatting of this note might be different from the original. Last Assessment & Plan:  Currently controlled per pt report. Reflective of t   PCOS (polycystic ovarian syndrome)    PONV (postoperative nausea and vomiting)    Psoriatic arthritis ([place]) 04/29/2016   Last Assessment & Plan:  Formatting of this note might be different from the original. Arthritis pain is currently controlled per Patient reported pain score of 5/10 with presence of symptoms including stiffness primarily localized to hands, wrists, ankles, and feet.Exacerbating factors include cold weather. Reflective of treatment with [place] Nail and meloxicam  Previous treatments include Humira and   PTSD (post-traumatic stress disorder)    Retrocalcaneal bursitis 05/20/2019   Sinus tachycardia 09/11/2015   Tendonitis, Achilles, right 04/18/2019   Type 2 diabetes mellitus with complication, with long-term current use of insulin ([place]) 04/02/2020   Unstable angina ([place]) 04/02/2020   Vitamin D deficiency 09/13/2019    Past Surgical History:  Procedure Laterality Date   ANTERIOR CERVICAL DECOMP/DISCECTOMY FUSION N/A 04/20/2018   Procedure: Cervical 5-6 Anterior cervical decompression/discectomy/fusion;  Surgeon: Ashok Pall, MD;  Location: [facility];  Service: Neurosurgery;  Laterality: N/A;  ervical 5-6 Anterior cervical decompression/discectomy/fusion   BACK SURGERY     Lumbar disectomy   CARPAL TUNNEL RELEASE Right    CHOLECYSTECTOMY  10/06   CORONARY STENT INTERVENTION N/A 04/02/2020   Procedure: CORONARY STENT INTERVENTION;  Surgeon: Martinique, Peter M, MD;  Location: [place] [facility];  Service:  Cardiovascular;  Laterality: N/A;  MID OM1   FINGER TENDON REPAIR Left 1994   index   KNEE ARTHROSCOPY Right [id number], 2010   LEFT HEART CATH AND CORONARY ANGIOGRAPHY N/A 04/02/2020   Procedure: LEFT HEART CATH AND CORONARY ANGIOGRAPHY;  Surgeon: Martinique, Peter M, MD;  Location: [place] [facility];  Service: Cardiovascular;  Laterality: N/A;   TONSILLECTOMY AND ADENOIDECTOMY  11/05    Current Medications: Current Meds  Medication Sig   ALPRAZolam (XANAX) 0.25 MG tablet Take 0.25 mg by mouth at bedtime as needed for sleep.    aspirin 81 MG chewable tablet Chew 1 tablet (81 mg total) by mouth daily.   Biotin 1 MG CAPS Take 1 mg by mouth daily.   clopidogrel (PLAVIX) 75 MG tablet Take 1 tablet (75 mg total) by mouth daily.   Collagen-Vitamin C-Biotin (COLLAGEN 1500/C) 500-50-0.8 MG CAPS Take 1 tablet by mouth daily.   cycloSPORINE (RESTASIS) 0.05 % ophthalmic emulsion Place 1 drop into both eyes in the morning and at bedtime.   ergocalciferol (VITAMIN D2) 50000 UNITS capsule Take  50,000 Units by mouth once a week.    Evolocumab (REPATHA SURECLICK) 470 MG/ML SOAJ Inject 140 mg into the skin every 14 (fourteen) days.   ferrous sulfate 324 MG TBEC Take 324 mg by mouth 3 (three) times a week.   folic acid (FOLVITE) 1 MG tablet Take 1 mg by mouth daily.   icosapent Ethyl (VASCEPA) 1 g capsule Take 2 capsules (2 g total) by mouth 2 (two) times daily.   losartan (COZAAR) 25 MG tablet Take 12.5 mg by mouth daily.   metFORMIN (GLUCOPHAGE) 1000 MG tablet Take 1,000 mg by mouth 2 (two) times daily with a meal.   metoprolol tartrate (LOPRESSOR) 25 MG tablet Take 12.5 mg by mouth 2 (two) times daily.   NURTEC 75 MG TBDP Take 75 mg by mouth at bedtime as needed for migraine.   ondansetron (ZOFRAN) 4 MG tablet Take 4 mg by mouth every 8 (eight) hours as needed for nausea.   pantoprazole (PROTONIX) 40 MG tablet Take 1 tablet (40 mg total) by mouth daily.   pravastatin (PRAVACHOL) 10 MG tablet Take 1 tablet  (10 mg total) by mouth 2 (two) times a week.   venlafaxine (EFFEXOR) 37.5 MG tablet Take 37.5 mg by mouth daily.   vitamin B-12 (CYANOCOBALAMIN) 1000 MCG tablet Take 1,000 mcg by mouth daily.    zolmitriptan (ZOMIG) 5 MG tablet Take 5 mg by mouth as needed for migraine.     Allergies:   Certolizumab pegol, Cymbalta [duloxetine hcl], Duloxetine, Elavil [amitriptyline hcl], Gabapentin, Humira [adalimumab], Lyrica [pregabalin], Ticagrelor, Ezetimibe-simvastatin, Fenofibrate, Miconazole, and Statins   Social History   Socioeconomic History   Marital status: Married    Spouse name: Not on file   Number of children: Not on file   Years of education: Not on file   Highest education level: Not on file  Occupational History   Not on file  Tobacco Use   Smoking status: Never   Smokeless tobacco: Never  Vaping Use   Vaping Use: Never used  Substance and Sexual Activity   Alcohol use: Not Currently   Drug use: No   Sexual activity: Yes    Birth control/protection: None  Other Topics Concern   Not on file  Social History Narrative   Not on file   Social Determinants of Health   Financial Resource Strain: Not on file  Food Insecurity: Not on file  Transportation Needs: Not on file  Physical Activity: Not on file  Stress: Not on file  Social Connections: Not on file     Family History: The patient's family history includes Cancer in her brother and mother; Diabetes in her brother, father, mother, and sister; Heart disease in her father, mother, and sister; Hypertension in her father, mother, and sister. ROS:   Please see the history of present illness.    All other systems reviewed and are negative.  EKGs/Labs/Other Studies Reviewed:    The following studies were reviewed today:  EKG:  EKG ordered today and personally reviewed.  The ekg ordered today demonstrates sinus rhythm normal    Physical Exam:    VS:  BP (!) 150/90 (BP Location: Right Arm, Patient Position: Sitting,  Cuff Size: Normal)    Pulse 80    Ht 5' 1.5" (1.562 m)    Wt 225 lb 9.6 oz (102.3 kg)    SpO2 99%    BMI 41.94 kg/m     Wt Readings from Last 3 Encounters:  08/27/21 225 lb 9.6 oz (102.3  kg)  01/15/21 227 lb (103 kg)  09/04/20 226 lb 6.4 oz (102.7 kg)     GEN:  Well nourished, well developed in no acute distress HEENT: Normal NECK: No JVD; No carotid bruits LYMPHATICS: No lymphadenopathy CARDIAC: RRR, no murmurs, rubs, gallops RESPIRATORY:  Clear to auscultation without rales, wheezing or rhonchi  ABDOMEN: Soft, non-tender, non-distended MUSCULOSKELETAL:  No edema; No deformity  SKIN: Warm and dry NEUROLOGIC:  Alert and oriented x 3 PSYCHIATRIC:  Normal affect    Signed, Shirlee More, MD  08/27/2021 9:25 AM    [place]

## 2021-08-27 NOTE — Patient Instructions (Signed)

## 2021-12-22 ENCOUNTER — Encounter: Payer: Self-pay | Admitting: Cardiology

## 2022-03-10 ENCOUNTER — Other Ambulatory Visit: Payer: Self-pay | Admitting: Cardiology

## 2022-04-19 ENCOUNTER — Encounter: Payer: Self-pay | Admitting: Cardiology

## 2022-06-14 ENCOUNTER — Encounter: Payer: Self-pay | Admitting: Cardiology

## 2022-06-14 ENCOUNTER — Ambulatory Visit: Payer: PRIVATE HEALTH INSURANCE | Attending: Cardiology | Admitting: Cardiology

## 2022-06-14 VITALS — BP 131/80 | HR 72 | Ht 62.0 in | Wt 187.6 lb

## 2022-06-14 DIAGNOSIS — I10 Essential (primary) hypertension: Secondary | ICD-10-CM | POA: Diagnosis not present

## 2022-06-14 DIAGNOSIS — E1159 Type 2 diabetes mellitus with other circulatory complications: Secondary | ICD-10-CM | POA: Diagnosis not present

## 2022-06-14 DIAGNOSIS — E782 Mixed hyperlipidemia: Secondary | ICD-10-CM

## 2022-06-14 DIAGNOSIS — I25118 Atherosclerotic heart disease of native coronary artery with other forms of angina pectoris: Secondary | ICD-10-CM | POA: Diagnosis not present

## 2022-06-14 DIAGNOSIS — Z789 Other specified health status: Secondary | ICD-10-CM

## 2022-06-14 MED ORDER — EZETIMIBE 10 MG PO TABS: 10.0000 mg | ORAL_TABLET | Freq: Every day | ORAL | 3 refills | Status: DC

## 2022-06-14 NOTE — Patient Instructions (Signed)
Medication Instructions:  Your physician has recommended you make the following change in your medication:  Stop Pravastatin Start Ezetimibe 10 Mg once daily  *If you need a refill on your cardiac medications before your next appointment, please call your pharmacy*   Lab Work: Your physician recommends that you return for lab work in: Check fasting Lipid Panel in 2 months Lab opens at [place] an appointment. Best time to come is between 8am and 12noon and between 1:30 and 4:30. If you have been asked to fast for your blood work please have nothing to eat or drink after midnight. You may have water.   If you have labs (blood work) drawn today and your tests are completely normal, you will receive your results only by: [place] (if you have MyChart) OR A paper copy in the mail If you have any lab test that is abnormal or we need to change your treatment, we will call you to review the results.   Testing/Procedures: NONE   Follow-Up: At [facility], you and your health needs are our priority.  As part of our continuing mission to provide you with exceptional heart care, we have created designated Provider Care Teams.  These Care Teams include your primary Cardiologist (physician) and Advanced Practice Providers (APPs -  Physician Assistants and Nurse Practitioners) who all work together to provide you with the care you need, when you need it.  We recommend signing up for the patient portal called "MyChart".  Sign up information is provided on this After Visit Summary.  MyChart is used to connect with patients for Virtual Visits (Telemedicine).  Patients are able to view lab/test results, encounter notes, upcoming appointments, etc.  Non-urgent messages can be sent to your provider as well.   To learn more about what you can do with MyChart, go to [email or web address].    Your next appointment:   9 month(s)  The format for your next appointment:   In  Person  Provider:   Shirlee More, MD    Other Instructions   Important Information About Sugar

## 2022-06-14 NOTE — Progress Notes (Signed)
Cardiology Office Note:    Date:  06/14/2022   ID:  Phyllis Delgado, DOB 13-Oct-1970, MRN [id number]  PCP:  Georganna Skeans, PA-C  Cardiologist:  Shirlee More, MD    Referring MD: Georganna Skeans, PA-C    ASSESSMENT:    1. Coronary artery disease of native artery of native heart with stable angina pectoris ([place])   2. Type 2 diabetes mellitus with other circulatory complication, without long-term current use of insulin ([place])   3. Essential hypertension   4. Hyperlipidemia, mixed   5. Statin intolerance    PLAN:    In order of problems listed above:  Phyllis Delgado continues to do well after PCI for CAD and has had no anginal discomfort on her current medical regimen including low-dose aspirin beta-blocker and lipid-lowering presently only taking Repatha.  To optimize lipid-lowering treatment would have her take Zetia 10 mg daily and will just discontinue statins that are not tolerated.  2 months recheck a lipid profile and LP(a).  If LDL remains excessive we could consider inclisiran Stable diabetes last A1c 5.4% See above, add Zetia to try to optimize LDL less than 70 Discontinue statin she cannot even tolerate low-dose intermittent pravastatin   Next appointment: 9 months   Medication Adjustments/Labs and Tests Ordered: Current medicines are reviewed at length with the patient today.  Concerns regarding medicines are outlined above.  No orders of the defined types were placed in this encounter.  No orders of the defined types were placed in this encounter.   No chief complaint on file.   History of Present Illness:    Phyllis Delgado is a 51 y.o. female with a hx of CAD with PCI and stent left circumflex marginal branch 04/02/2020 type 2 diabetes hypertension hyperlipidemia and statin intolerance last seen 08/27/2021.  Compliance with diet, lifestyle and medications: Yes  She has difficulty tolerating) palpable with GI upset and recently has not taken a statin because of muscle pain.   Most recent lipid profile is over 04/13/2022 LDL 103 non-HDL cholesterol 124 total 161 triglycerides 233 She gets about 01-6999 steps a day and really feels dramatically improved no exercise intolerance edema shortness of breath chest pain palpitation or syncope  Recent labs 04/13/2022: Cholesterol 161 LDL 103 triglycerides 233 HDL 37 non-HDL cholesterol 124 CMP normal sodium 138 potassium 4.1 creatinine 0.78 normal liver function test Hemoglobin 13.0 platelets 350,000 Past Medical History:  Diagnosis Date   Adjustment reaction with anxiety and depression 09/13/2019   Amenorrhea    Anemia    Anxiety    Arthritis    Asthma    with sesonal allergies   B12 deficiency 09/13/2019   Bilateral lower extremity edema 09/13/2019   Bone spur of posterior portion of right calcaneus 04/25/2019   Cervical spondylosis with myelopathy and radiculopathy 08/21/7937   Complication of anesthesia    Depression    Diabetes mellitus    Diarrhea    "from Metformin"   Elevated cholesterol    Family history of adverse reaction to anesthesia    Mother , N/V   GERD (gastroesophageal reflux disease)    Hallux limitus    left great toe   Hallux rigidus    right great toe   Hallux rigidus, right foot 03/01/2018   Heel cord tightness, right 04/18/2019   History of hand surgery    FINGER--TENDON REPAIR   Hx of discectomy JUNE 2005   L4,L5, S1    Hyperlipidemia, mixed 04/02/2020   Hypertension  Infertility, female    PRIMARY   Obesity, Class III, BMI 40-49.9 (morbid obesity) ([place]) 09/13/2019   Palpitations 09/11/2015   Last Assessment & Plan:  Formatting of this note might be different from the original. Currently controlled per pt report. Reflective of treatment with metoprolol  Continue current regimen  Discussed home monitoring of HR with frequency of few times per month Formatting of this note might be different from the original. Last Assessment & Plan:  Currently controlled per pt report. Reflective of t    PCOS (polycystic ovarian syndrome)    PONV (postoperative nausea and vomiting)    Psoriatic arthritis ([place]) 04/29/2016   Last Assessment & Plan:  Formatting of this note might be different from the original. Arthritis pain is currently controlled per Patient reported pain score of 5/10 with presence of symptoms including stiffness primarily localized to hands, wrists, ankles, and feet.Exacerbating factors include cold weather. Reflective of treatment with [place] and meloxicam  Previous treatments include Humira and   PTSD (post-traumatic stress disorder)    Retrocalcaneal bursitis 05/20/2019   Sinus tachycardia 09/11/2015   Tendonitis, Achilles, right 04/18/2019   Type 2 diabetes mellitus with complication, with long-term current use of insulin ([place]) 04/02/2020   Unstable angina ([place]) 04/02/2020   Vitamin D deficiency 09/13/2019    Past Surgical History:  Procedure Laterality Date   ANTERIOR CERVICAL DECOMP/DISCECTOMY FUSION N/A 04/20/2018   Procedure: Cervical 5-6 Anterior cervical decompression/discectomy/fusion;  Surgeon: Ashok Pall, MD;  Location: [facility];  Service: Neurosurgery;  Laterality: N/A;  ervical 5-6 Anterior cervical decompression/discectomy/fusion   BACK SURGERY     Lumbar disectomy   CARPAL TUNNEL RELEASE Right    CHOLECYSTECTOMY  10/06   CORONARY STENT INTERVENTION N/A 04/02/2020   Procedure: CORONARY STENT INTERVENTION;  Surgeon: Martinique, Peter M, MD;  Location: [facility];  Service: Cardiovascular;  Laterality: N/A;  MID OM1   FINGER TENDON REPAIR Left 1994   index   KNEE ARTHROSCOPY Right [id number], 2010   LEFT HEART CATH AND CORONARY ANGIOGRAPHY N/A 04/02/2020   Procedure: LEFT HEART CATH AND CORONARY ANGIOGRAPHY;  Surgeon: Martinique, Peter M, MD;  Location: [facility];  Service: Cardiovascular;  Laterality: N/A;   TONSILLECTOMY AND ADENOIDECTOMY  11/05    Current Medications: Current Meds  Medication Sig   ALPRAZolam (XANAX) 0.25 MG tablet Take 0.25 mg by  mouth at bedtime as needed for sleep.    aspirin 81 MG chewable tablet Chew 1 tablet (81 mg total) by mouth daily.   cycloSPORINE (RESTASIS) 0.05 % ophthalmic emulsion Place 1 drop into both eyes in the morning and at bedtime.   ergocalciferol (VITAMIN D2) 50000 UNITS capsule Take 50,000 Units by mouth once a week.    ferrous sulfate 324 MG TBEC Take 324 mg by mouth 3 (three) times a week.   folic acid (FOLVITE) 1 MG tablet Take 1 mg by mouth daily.   icosapent Ethyl (VASCEPA) 1 g capsule Take 2 capsules (2 g total) by mouth 2 (two) times daily.   losartan (COZAAR) 25 MG tablet Take 12.5 mg by mouth daily.   metFORMIN (GLUCOPHAGE) 1000 MG tablet Take 1,000 mg by mouth 2 (two) times daily with a meal.   metoprolol tartrate (LOPRESSOR) 25 MG tablet Take 12.5 mg by mouth 2 (two) times daily.   NURTEC 75 MG TBDP Take 75 mg by mouth at bedtime as needed for migraine.   ondansetron (ZOFRAN) 4 MG tablet Take 4 mg by mouth every 8 (eight)  hours as needed for nausea.   pantoprazole (PROTONIX) 40 MG tablet Take 1 tablet (40 mg total) by mouth daily.   pravastatin (PRAVACHOL) 10 MG tablet Take 1 tablet (10 mg total) by mouth 2 (two) times a week.   REPATHA SURECLICK 829 MG/ML SOAJ Inject 140 mg into the skin every 14 (fourteen) days.   tirzepatide (MOUNJARO) 10 MG/0.5ML Pen Inject 10 mg into the skin once a week.   venlafaxine (EFFEXOR) 37.5 MG tablet Take 37.5 mg by mouth daily.   vitamin B-12 (CYANOCOBALAMIN) 1000 MCG tablet Take 1,000 mcg by mouth daily.    zolmitriptan (ZOMIG) 5 MG tablet Take 5 mg by mouth as needed for migraine.     Allergies:   Certolizumab pegol, Cymbalta [duloxetine hcl], Duloxetine, Elavil [amitriptyline hcl], Gabapentin, Humira [adalimumab], Lyrica [pregabalin], Ticagrelor, Ezetimibe-simvastatin, Fenofibrate, Miconazole, and Statins   Social History   Socioeconomic History   Marital status: Married    Spouse name: Not on file   Number of children: Not on file   Years of  education: Not on file   Highest education level: Not on file  Occupational History   Not on file  Tobacco Use   Smoking status: Never   Smokeless tobacco: Never  Vaping Use   Vaping Use: Never used  Substance and Sexual Activity   Alcohol use: Not Currently   Drug use: No   Sexual activity: Yes    Birth control/protection: None  Other Topics Concern   Not on file  Social History Narrative   Not on file   Social Determinants of Health   Financial Resource Strain: Not on file  Food Insecurity: Not on file  Transportation Needs: Not on file  Physical Activity: Not on file  Stress: Not on file  Social Connections: Not on file     Family History: The patient's family history includes Cancer in her brother and mother; Diabetes in her brother, father, mother, and sister; Heart disease in her father, mother, and sister; Hypertension in her father, mother, and sister. ROS:   Please see the history of present illness.    All other systems reviewed and are negative.  EKGs/Labs/Other Studies Reviewed:    The following studies were reviewed today:   Procedures left heart catheterization 04/02/2020   CORONARY STENT INTERVENTION  LEFT HEART CATH AND CORONARY ANGIOGRAPHY  Conclusion     1st Mrg lesion is 95% stenosed. Post intervention, there is a 0% residual stenosis. A drug-eluting stent was successfully placed using a SYNERGY XD 2.75X32. The left ventricular systolic function is normal. LV end diastolic pressure is normal. The left ventricular ejection fraction is 55-65% by visual estimate.   1. Single vessel obstructive CAD involving a large first OM 2. Normal LV function 3. Normal LVEDP.  4. Successful PCI of the first OM with DES x 1   Physical Exam:    VS:  BP 131/80 (BP Location: Left Arm, Patient Position: Sitting, Cuff Size: Normal)   Pulse 72   Ht '5\' 2"'$  (1.575 m)   Wt 187 lb 9.6 oz (85.1 kg)   SpO2 99%   BMI 34.31 kg/m     Wt Readings from Last 3  Encounters:  06/14/22 187 lb 9.6 oz (85.1 kg)  08/27/21 225 lb 9.6 oz (102.3 kg)  01/15/21 227 lb (103 kg)     GEN:  Well nourished, well developed in no acute distress HEENT: Normal NECK: No JVD; No carotid bruits LYMPHATICS: No lymphadenopathy CARDIAC: RRR, no murmurs, rubs, gallops  RESPIRATORY:  Clear to auscultation without rales, wheezing or rhonchi  ABDOMEN: Soft, non-tender, non-distended MUSCULOSKELETAL:  No edema; No deformity  SKIN: Warm and dry NEUROLOGIC:  Alert and oriented x 3 PSYCHIATRIC:  Normal affect    Signed, Shirlee More, MD  06/14/2022 9:24 AM    [place]

## 2022-06-29 ENCOUNTER — Telehealth: Payer: Self-pay

## 2022-06-29 NOTE — Telephone Encounter (Signed)
Phyllis Delgado (Key: [id number]) Rx #: [id number] Repatha SureClick '140MG'$ /ML auto-injectors  Wait for Determination Please wait for [employer] 2017 NCPDP to return a determination.

## 2022-06-29 NOTE — Telephone Encounter (Signed)
Message from Plan Request Reference Number: [id number]. REPATHA SURE INJ '140MG'$ /ML is approved through 06/30/2023.

## 2022-07-12 ENCOUNTER — Encounter: Payer: Self-pay | Admitting: Cardiology

## 2022-07-15 ENCOUNTER — Other Ambulatory Visit: Payer: Self-pay

## 2022-07-15 MED ORDER — NEXLETOL 180 MG PO TABS: 180.0000 mg | ORAL_TABLET | Freq: Every day | ORAL | 3 refills | Status: DC

## 2022-07-19 ENCOUNTER — Telehealth: Payer: Self-pay

## 2022-07-19 NOTE — Telephone Encounter (Signed)
PA submitted on CMM for Nexletol180 mg. Key [id number]

## 2022-08-09 NOTE — Telephone Encounter (Signed)
PA denied on CMM for Nexletol '108mg'$  denied. The denial was based on our criteria for Nexletol Tab '180mg'$ .  Per your health plan's criteria, this drug is covered if you meet the following: You have one of the cholesterol values [low-density lipoprotein cholesterol (LDL-C)] while on maximally tolerated cholesterol-lowering drug in the last 120 days: (A) LDL-C is greater than or equal to '55mg'$ /dL with the cholesterol disorder (atherosclerotic cardiovascular disease - plaque builds up inside the arteries of your heart). (B) LDL-C is greater than or equal to '100mg'$ /dL without the cholesterol disorder (atherosclerotic cardiovascular disease - plaque builds up inside the arteries of your heart)

## 2022-08-16 ENCOUNTER — Encounter: Payer: Self-pay | Admitting: Cardiology

## 2023-09-11 ENCOUNTER — Encounter: Payer: Self-pay | Admitting: Cardiology

## 2023-09-11 NOTE — Progress Notes (Signed)
Cardiology Office Note:  .   Date:  09/15/2023  ID:  Phyllis Delgado, DOB Aug 10, 1971, MRN [id number] PCP: Howell Pringle, PA-C  [facility] Providers Cardiologist:  Norman Herrlich, MD    History of Present Illness: .   Phyllis Delgado is a 53 y.o. female with a past medical history of CAD s/p DES to OM in 2021, hypertension, DM 2, dyslipidemia, PTSD, rheumatoid arthritis .  04/02/2020 left heart cath single vessel CAD, s/p PCI of OM with DES x 1 04/01/2020 MPI No reversible ischemia  Most recently evaluated by Dr. Dulce Delgado on 06/14/2022, she was stable from a cardiac perspective, advised to follow-up in 9 months.  She presents today for follow-up of her CAD.  Since she was last evaluated in our office she has had a lot of musculoskeletal injuries, and has been dealing with that.  She is also had some fluctuations with her blood pressure, her PCP made some changes and it appears well-controlled today.  She offers no formal complaints.  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  She is not able to participate in any formal exercise right now secondary to her musculoskeletal injuries. She denies chest pain, palpitations, dyspnea, pnd, orthopnea, n, v, dizziness, syncope, edema, weight gain, or early satiety.   ROS: Review of Systems  Musculoskeletal:  Positive for joint pain and myalgias.  All other systems reviewed and are negative.    Studies Reviewed: Marland Kitchen   EKG Interpretation Date/Time:  Friday September 15 2023 13:33:45 EST Ventricular Rate:  103 PR Interval:  136 QRS Duration:  68 QT Interval:  348 QTC Calculation: 455 R Axis:   -30  Text Interpretation: Sinus tachycardia no changes Left axis deviation Low voltage QRS Cannot rule out Anterior infarct (cited on or before 03-Apr-2020) When compared with ECG of 03-Apr-2020 03:46, No significant change was found Confirmed by Phyllis Delgado [phone]) on 09/15/2023 1:37:56 PM    Cardiac Studies & Procedures   CARDIAC  CATHETERIZATION  CARDIAC CATHETERIZATION 04/02/2020  Narrative  1st Mrg lesion is 95% stenosed.  Post intervention, there is a 0% residual stenosis.  A drug-eluting stent was successfully placed using a SYNERGY XD 2.75X32.  The left ventricular systolic function is normal.  LV end diastolic pressure is normal.  The left ventricular ejection fraction is 55-65% by visual estimate.  1. Single vessel obstructive CAD involving a large first OM 2. Normal LV function 3. Normal LVEDP 4. Successful PCI of the first OM with DES x 1.  Plan: DAPT for one year. Patient has a history of intolerance to statins. Will try pravastatin 10 mg daily. Anticipate same day DC.  Findings Coronary Findings Diagnostic  Dominance: Right  Left Main Vessel was injected. Vessel is normal in caliber. Vessel is angiographically normal.  Left Anterior Descending Vessel was injected. Vessel is normal in caliber. Vessel is angiographically normal.  Left Circumflex The vessel is tortuous.  First Obtuse Marginal Branch 1st Mrg lesion is 95% stenosed. The lesion is segmental.  Right Coronary Artery Vessel was injected. Vessel is normal in caliber. Vessel is angiographically normal.  Intervention  1st Mrg lesion Stent CATH LAUNCHER 6FR EBU3.5 guide catheter was inserted. Lesion crossed with guidewire using a WIRE ASAHI PROWATER 180CM. Pre-stent angioplasty was performed using a BALLOON SAPPHIRE 2.5X15. A drug-eluting stent was successfully placed using a SYNERGY XD 2.75X32. Stent strut is well apposed. Post-stent angioplasty was performed using a BALLOON SAPPHIRE Protection 3.0X18. Maximum pressure:  16  atm. Post-Intervention Lesion Assessment The intervention was successful. Pre-interventional TIMI flow is 3. Post-intervention TIMI flow is 3. At this lesion, acute closure of the vessel occurred. The acute closure reopened. There is a 0% residual stenosis post intervention.   STRESS TESTS  MYOCARDIAL PERFUSION  IMAGING 04/01/2020              Risk Assessment/Calculations:             Physical Exam:   VS:  BP 120/84 (BP Location: Left Arm, Patient Position: Sitting, Cuff Size: Normal)   Pulse (!) 103   Ht 5\' 2"  (1.575 m)   SpO2 96%   BMI 34.31 kg/m    Wt Readings from Last 3 Encounters:  06/14/22 187 lb 9.6 oz (85.1 kg)  08/27/21 225 lb 9.6 oz (102.3 kg)  01/15/21 227 lb (103 kg)    GEN: Well nourished, well developed in no acute distress NECK: No JVD; No carotid bruits CARDIAC: RRR, no murmurs, rubs, gallops RESPIRATORY:  Clear to auscultation without rales, wheezing or rhonchi  ABDOMEN: Soft, non-tender, non-distended EXTREMITIES:  No edema; No deformity   ASSESSMENT AND PLAN: .   CAD-s/p DES to OM 2021. Stable with no anginal symptoms. No indication for ischemic evaluation.  Continue aspirin 81 mg daily, continue Repatha, continue Repatha 25 mg twice daily, continue nitroglycerin as needed. Heart healthy diet and regular cardiovascular exercise encouraged.    Dyslipidemia statin intolerance-has been monitored closely by her PCP, she is currently on Repatha however she has not been taking it as prescribed at is just hard for her to remember although she is committed to trying to take it every 2 weeks.  Most recent LDL is elevated at 96 in January of this year, would prefer this to be less than 70.    Hypertension-blood pressure is well-controlled at 120/84, continue amlodipine 5 mg daily.  DM2-most recent A1c was well-controlled at 5.9%.    Dispo: Follow-up in 1 year.  Signed, Phyllis Dibble, NP

## 2023-09-13 ENCOUNTER — Other Ambulatory Visit: Payer: Self-pay

## 2023-09-15 ENCOUNTER — Encounter: Payer: Self-pay | Admitting: Cardiology

## 2023-09-15 ENCOUNTER — Ambulatory Visit: Payer: PRIVATE HEALTH INSURANCE | Attending: Cardiology | Admitting: Cardiology

## 2023-09-15 VITALS — BP 120/84 | HR 103 | Ht 62.0 in

## 2023-09-15 DIAGNOSIS — E782 Mixed hyperlipidemia: Secondary | ICD-10-CM

## 2023-09-15 DIAGNOSIS — I10 Essential (primary) hypertension: Secondary | ICD-10-CM

## 2023-09-15 DIAGNOSIS — I25118 Atherosclerotic heart disease of native coronary artery with other forms of angina pectoris: Secondary | ICD-10-CM | POA: Diagnosis not present

## 2023-09-15 DIAGNOSIS — E1159 Type 2 diabetes mellitus with other circulatory complications: Secondary | ICD-10-CM | POA: Diagnosis not present

## 2023-09-15 DIAGNOSIS — Z789 Other specified health status: Secondary | ICD-10-CM | POA: Diagnosis not present

## 2023-09-15 NOTE — Patient Instructions (Signed)
Medication Instructions:  Your physician recommends that you continue on your current medications as directed. Please refer to the Current Medication list given to you today.  *If you need a refill on your cardiac medications before your next appointment, please call your pharmacy*   Lab Work: NONE If you have labs (blood work) drawn today and your tests are completely normal, you will receive your results only by: MyChart Message (if you have MyChart) OR A paper copy in the mail If you have any lab test that is abnormal or we need to change your treatment, we will call you to review the results.   Testing/Procedures: NONE   Follow-Up: At [facility], you and your health needs are our priority.  As part of our continuing mission to provide you with exceptional heart care, we have created designated Provider Care Teams.  These Care Teams include your primary Cardiologist (physician) and Advanced Practice Providers (APPs -  Physician Assistants and Nurse Practitioners) who all work together to provide you with the care you need, when you need it.  We recommend signing up for the patient portal called "MyChart".  Sign up information is provided on this After Visit Summary.  MyChart is used to connect with patients for Virtual Visits (Telemedicine).  Patients are able to view lab/test results, encounter notes, upcoming appointments, etc.  Non-urgent messages can be sent to your provider as well.   To learn more about what you can do with MyChart, go to [email or web address].    Your next appointment:   1 year(s) Provider:   Brian Munley, MD   Other Instructions   

## 2023-09-29 ENCOUNTER — Telehealth: Payer: Self-pay

## 2023-09-29 NOTE — Telephone Encounter (Signed)
   Pre-operative Risk Assessment    Patient Name: Phyllis Delgado  DOB: 06-12-1971 MRN: [id number]   Date of last office visit: 09-15-23 Date of next office visit: N/A   Request for Surgical Clearance    Procedure:   L4-5 Fusion  Date of Surgery:  Clearance TBD                                Surgeon:  Dr. Noah Charon Group or Practice Name:  [facility] Phone number:   Fax number:  [phone]   Type of Clearance Requested:   - Medical    Type of Anesthesia:  General    Additional requests/questions:    Merlene Laughter   09/29/2023, 7:31 AM

## 2023-09-29 NOTE — Telephone Encounter (Signed)
Good Morning Phyllis Delgado,  We have received a surgical clearance request for Ms. Gibler will be undergoing an L4-5 fusion. They were seen recently in clinic on 09/15/2023. Can you please comment on surgical clearance for upcoming procedure. Please forward you guidance and recommendations to P [facility] PREOP   Thanks, Alden Server

## 2023-09-29 NOTE — Telephone Encounter (Signed)
   Patient Name: Phyllis Delgado  DOB: [birth year] MRN: [id number]  Primary Cardiologist: Norman Herrlich, MD  Chart reviewed as part of pre-operative protocol coverage. Given past medical history and time since last visit, based on ACC/AHA guidelines, Phyllis Delgado is at acceptable risk for the planned procedure without further cardiovascular testing.   The patient was advised that if she develops new symptoms prior to surgery to contact our office to arrange for a follow-up visit, and she verbalized understanding.  Patient can hold ASA 81 mg 7 days prior to procedure and should restart postprocedure when surgically safe and hemostasis is achieved.  I will route this recommendation to the requesting party via Epic fax function and remove from pre-op pool.  Please call with questions.  Napoleon Form, Leodis Rains, NP 09/29/2023, 8:10 AM

## 2023-12-27 ENCOUNTER — Encounter: Payer: Self-pay | Admitting: Cardiology

## 2023-12-27 ENCOUNTER — Telehealth: Payer: Self-pay | Admitting: Pharmacy Technician

## 2023-12-27 MED ORDER — REPATHA SURECLICK 140 MG/ML ~~LOC~~ SOAJ: 140.0000 mg | SUBCUTANEOUS | 2 refills | Status: AC

## 2023-12-27 NOTE — Telephone Encounter (Signed)
 Pharmacy Patient Advocate Encounter   Received notification from CoverMyMeds that prior authorization for REPATHA  is required/requested.   Insurance verification completed.   The patient is insured through [employer] .   Per test claim: PA required; PA submitted to above mentioned insurance via CoverMyMeds Key/confirmation #/EOC [employer]  Status is pending

## 2023-12-27 NOTE — Telephone Encounter (Signed)
 Pharmacy Patient Advocate Encounter  Received notification from advocate health that Prior Authorization for repatha  has been APPROVED from 12/27/23 to 08/14/2038. Spoke to pharmacy to process.Copay is $74.97 for 84 days .    PA #/Case ID/Reference #: [id number]
